# Patient Record
Sex: Male | Born: 1955 | Race: White | Hispanic: No | Smoking: Former smoker
Health system: Southern US, Community
[De-identification: ages and names within clinical notes are randomized; demographics above are authoritative.]

## PROBLEM LIST (undated history)

## (undated) DIAGNOSIS — I10 Essential (primary) hypertension: Secondary | ICD-10-CM

## (undated) DIAGNOSIS — I38 Endocarditis, valve unspecified: Secondary | ICD-10-CM

## (undated) DIAGNOSIS — E039 Hypothyroidism, unspecified: Secondary | ICD-10-CM

## (undated) DIAGNOSIS — I4892 Unspecified atrial flutter: Secondary | ICD-10-CM

## (undated) DIAGNOSIS — N179 Acute kidney failure, unspecified: Secondary | ICD-10-CM

## (undated) DIAGNOSIS — E785 Hyperlipidemia, unspecified: Secondary | ICD-10-CM

## (undated) HISTORY — DX: Hyperlipidemia, unspecified: E78.5

## (undated) HISTORY — DX: Acute kidney failure, unspecified: N17.9

## (undated) HISTORY — PX: CERVICAL SPINE SURGERY: SHX589

## (undated) HISTORY — DX: Hypothyroidism, unspecified: E03.9

## (undated) HISTORY — DX: Essential (primary) hypertension: I10

## (undated) HISTORY — DX: Endocarditis, valve unspecified: I38

## (undated) HISTORY — DX: Unspecified atrial flutter: I48.92

## (undated) HISTORY — PX: KNEE SURGERY: SHX244

---

## 2001-01-18 ENCOUNTER — Emergency Department (HOSPITAL_COMMUNITY): Admission: EM | Admit: 2001-01-18 | Discharge: 2001-01-18 | Payer: Self-pay | Admitting: Emergency Medicine

## 2001-01-18 ENCOUNTER — Encounter: Payer: Self-pay | Admitting: Emergency Medicine

## 2002-01-14 ENCOUNTER — Encounter: Payer: Self-pay | Admitting: Neurological Surgery

## 2002-01-14 ENCOUNTER — Observation Stay (HOSPITAL_COMMUNITY): Admission: RE | Admit: 2002-01-14 | Discharge: 2002-01-14 | Payer: Self-pay | Admitting: Neurological Surgery

## 2002-05-08 ENCOUNTER — Encounter: Admission: RE | Admit: 2002-05-08 | Discharge: 2002-05-08 | Payer: Self-pay | Admitting: Neurological Surgery

## 2005-08-02 ENCOUNTER — Emergency Department (HOSPITAL_COMMUNITY): Admission: EM | Admit: 2005-08-02 | Discharge: 2005-08-03 | Payer: Self-pay | Admitting: *Deleted

## 2012-08-22 DIAGNOSIS — Z01810 Encounter for preprocedural cardiovascular examination: Secondary | ICD-10-CM

## 2012-08-22 DIAGNOSIS — I4892 Unspecified atrial flutter: Secondary | ICD-10-CM

## 2012-08-26 DIAGNOSIS — I4892 Unspecified atrial flutter: Secondary | ICD-10-CM

## 2012-09-09 ENCOUNTER — Encounter: Payer: Self-pay | Admitting: *Deleted

## 2012-09-11 ENCOUNTER — Encounter: Payer: Self-pay | Admitting: *Deleted

## 2012-09-11 ENCOUNTER — Telehealth: Payer: Self-pay | Admitting: Cardiology

## 2012-09-11 ENCOUNTER — Ambulatory Visit (INDEPENDENT_AMBULATORY_CARE_PROVIDER_SITE_OTHER): Payer: 59 | Admitting: Physician Assistant

## 2012-09-11 ENCOUNTER — Encounter: Payer: Self-pay | Admitting: Physician Assistant

## 2012-09-11 ENCOUNTER — Other Ambulatory Visit: Payer: Self-pay | Admitting: *Deleted

## 2012-09-11 VITALS — BP 115/79 | HR 71 | Ht 71.0 in | Wt 203.0 lb

## 2012-09-11 DIAGNOSIS — I4891 Unspecified atrial fibrillation: Secondary | ICD-10-CM

## 2012-09-11 DIAGNOSIS — N179 Acute kidney failure, unspecified: Secondary | ICD-10-CM

## 2012-09-11 DIAGNOSIS — I38 Endocarditis, valve unspecified: Secondary | ICD-10-CM | POA: Insufficient documentation

## 2012-09-11 DIAGNOSIS — I10 Essential (primary) hypertension: Secondary | ICD-10-CM | POA: Insufficient documentation

## 2012-09-11 DIAGNOSIS — I4892 Unspecified atrial flutter: Secondary | ICD-10-CM | POA: Insufficient documentation

## 2012-09-11 NOTE — Patient Instructions (Addendum)
Your physician recommends that you schedule a follow-up appointment in: 2 weeks. Your physician has recommended you make the following change in your medication: Decrease aspirin to 81 mg daily. All other medications will remain the same.  Your physician has requested that you have en exercise stress myoview. For further information please visit [email or web address] Please follow instruction sheet, as given.

## 2012-09-11 NOTE — Assessment & Plan Note (Signed)
Mild MR and TR, by recent echocardiogram  

## 2012-09-11 NOTE — Telephone Encounter (Signed)
stress cardiolite SET FOR 09-16-2012 mmh  Checking percert

## 2012-09-11 NOTE — Assessment & Plan Note (Signed)
On current medication regimen, followed by primary M.D. 

## 2012-09-11 NOTE — Telephone Encounter (Signed)
Per [facility] online no precert required for nuc stress test for members plan

## 2012-09-11 NOTE — Progress Notes (Signed)
Primary Cardiologist: Simona Huh, MD (new)   HPI: Post hospital followup from [facility], recently seen in consultation for evaluation of new onset atrial flutter of unknown duration, and preoperative clearance.  Patient presented with no known history of heart disease, and CRFs notable for HTN, history of tobacco, and age. He had no history of prior cardiac diagnostic testing.  Following an echocardiogram which indicated NL LVF, and no focal WMAs, the patient was cleared to proceed with I&D of a perineal abscess.  Patient subsequently converted to NSR, wherein he remained by time of discharge. We assessed him with a CHADS score of 1 (HTN). He was initially treated with IV diltiazem and low-dose ASA, with final recommendation to discharge on Cardizem CD 240 daily, and to resume low-dose atenolol 25 mg daily.  Patient did require repletion of hypokalemia (low 2.9), and also received supplemental magnesium. TSH NL.   Clinically, he denies any exertional CP, PND, or tachycardia palpitations. He has since had followup blood work with his primary M.D., as well as followup with his general surgeon.   12-lead EKG today, reviewed by me, indicates NSR 71 bpm  Allergies  Allergen Reactions  . Sulfa Antibiotics Rash    Current Outpatient Prescriptions  Medication Sig Dispense Refill  . atenolol (TENORMIN) 50 MG tablet Take 50 mg by mouth daily.      Marland Kitchen HYDROcodone-acetaminophen (NORCO/VICODIN) 5-325 MG per tablet Take 1 tablet by mouth as needed.      Marland Kitchen levothyroxine (SYNTHROID, LEVOTHROID) 150 MCG tablet Take 150 mcg by mouth daily before breakfast.      . lisinopril-hydrochlorothiazide (ZESTORETIC) 10-12.5 MG per tablet Take 2 tablets by mouth daily.       . pravastatin (PRAVACHOL) 40 MG tablet Take 40 mg by mouth daily.       No current facility-administered medications for this visit.    Past Medical History  Diagnosis Date  . Hypertension   . Hypothyroidism   . Hyperlipidemia   . Atrial  flutter     CHADS: 1  . Acute renal failure   . Valvular heart disease     Mild MR, TR, echo, 07/2012    Past Surgical History  Procedure Laterality Date  . Cervical spine surgery    . Knee surgery      History   Social History  . Marital Status: Divorced    Spouse Name: N/A    Number of Children: N/A  . Years of Education: N/A   Occupational History  . Not on file.   Social History Main Topics  . Smoking status: Former Smoker -- 1.00 packs/day for 35 years    Types: Cigarettes    Start date: 05/01/1972    Quit date: 05/01/2010  . Smokeless tobacco: Former Neurosurgeon    Types: Chew    Quit date: 05/01/1988  . Alcohol Use: Not on file  . Drug Use: Not on file  . Sexually Active: Not on file   Other Topics Concern  . Not on file   Social History Narrative  . No narrative on file    Family History  Problem Relation Age of Onset  . Hypertension      ROS: no nausea, vomiting; no fever, chills; no melena, hematochezia; no claudication  PHYSICAL EXAM: BP 115/79  Pulse 71  Ht 5\' 11"  (1.803 m)  Wt 203 lb (92.08 kg)  BMI 28.33 kg/m2 GENERAL: 57 year old male; NAD HEENT: NCAT, PERRLA, EOMI; sclera clear; no xanthelasma NECK: palpable bilateral carotid pulses, no bruits;  no JVD; no TM LUNGS: CTA bilaterally CARDIAC: RRR (S1, S2); no significant murmurs; no rubs or gallops ABDOMEN: soft, non-tender; intact BS EXTREMETIES: no significant peripheral edema SKIN: warm/dry; no obvious rash/lesions MUSCULOSKELETAL: no joint deformity NEURO: no focal deficit; NL affect   EKG: reviewed and available in Electronic Records   ASSESSMENT & PLAN:  Atrial flutter Maintaining NSR, as documented by EKG. As noted, patient presented with no history of palpitations, and was unaware that he was in atrial flutter during recent hospitalization. He has a CHADS score of 1, and we will decrease full dose ASA to 81 mg daily. Of note, he was not discharged on Cardizem, as we had  recommended. However, he presents with stable BP and heart rate, in NSR, and can continue on his previous dose of atenolol. We will, however, schedule him for an exercise stress Cardiolite test for risk stratification, given his noted CRFs. He also had slight troponin elevation (0.09), postoperatively. He also has history of tobacco smoking, and quit approximately 3 years ago.   Hypertension On current medication regimen, followed by primary M.D.  Valvular heart disease Mild MR and TR, by recent echocardiogram    Gene Slate Debroux, PAC

## 2012-09-11 NOTE — Assessment & Plan Note (Signed)
Maintaining NSR, as documented by EKG. As noted, patient presented with no history of palpitations, and was unaware that he was in atrial flutter during recent hospitalization. He has a CHADS score of 1, and we will decrease full dose ASA to 81 mg daily. Of note, he was not discharged on Cardizem, as we had recommended. However, he presents with stable BP and heart rate, in NSR, and can continue on his previous dose of atenolol. We will, however, schedule him for an exercise stress Cardiolite test for risk stratification, given his noted CRFs. He also had slight troponin elevation (0.09), postoperatively. He also has history of tobacco smoking, and quit approximately 3 years ago.

## 2012-09-16 DIAGNOSIS — I4891 Unspecified atrial fibrillation: Secondary | ICD-10-CM

## 2012-09-26 ENCOUNTER — Telehealth: Payer: Self-pay | Admitting: *Deleted

## 2012-09-26 NOTE — Telephone Encounter (Signed)
Notes Recorded by Lesle Chris, LPN on 1/47/8295 at 10:47 AM Patient notified. Already has follow up scheduled for 6/2 with Gene Serpe, PA.

## 2012-09-26 NOTE — Telephone Encounter (Signed)
Message copied by Lesle Chris on Thu Sep 26, 2012 10:47 AM ------      Message from: Prescott Parma C      Created: Wed Sep 18, 2012  4:59 PM       Neg stress test       ------

## 2012-09-30 ENCOUNTER — Ambulatory Visit (INDEPENDENT_AMBULATORY_CARE_PROVIDER_SITE_OTHER): Payer: 59 | Admitting: Physician Assistant

## 2012-09-30 ENCOUNTER — Encounter: Payer: Self-pay | Admitting: Physician Assistant

## 2012-09-30 VITALS — BP 112/76 | HR 78 | Ht 71.0 in | Wt 205.0 lb

## 2012-09-30 DIAGNOSIS — I4892 Unspecified atrial flutter: Secondary | ICD-10-CM

## 2012-09-30 DIAGNOSIS — I38 Endocarditis, valve unspecified: Secondary | ICD-10-CM

## 2012-09-30 DIAGNOSIS — I10 Essential (primary) hypertension: Secondary | ICD-10-CM

## 2012-09-30 NOTE — Progress Notes (Signed)
Primary Cardiologist: Simona Huh, MD   HPI: Scheduled early followup for review of GXT Cardiolite result, arranged at time of recent post hospital followup, to screen for ischemic heart disease, given multiple CRFs, and recent presentation with atrial flutter and slightly elevated troponin.   - GXT Cardiolite, 5/19: low risk study with NL perfusion; EF 61% (90% MPHR)  Patient continues to report no CP or tachycardia palpitations. He does, however, complain of occasional lightheadedness, which he ascribes to increased dose of Zestoretic approximately 2 months ago, by his primary M.D. He denies any associated tachycardia palpitations. This is typically precipitated after bending over. He also suggests 1 vertigo-like episode.  Allergies  Allergen Reactions  . Sulfa Antibiotics Rash    Current Outpatient Prescriptions  Medication Sig Dispense Refill  . aspirin EC 81 MG tablet Take 81 mg by mouth daily.      Marland Kitchen atenolol (TENORMIN) 50 MG tablet Take 50 mg by mouth daily.      Marland Kitchen HYDROcodone-acetaminophen (NORCO/VICODIN) 5-325 MG per tablet Take 1 tablet by mouth as needed.      Marland Kitchen levothyroxine (SYNTHROID, LEVOTHROID) 150 MCG tablet Take 150 mcg by mouth daily before breakfast.      . lisinopril-hydrochlorothiazide (ZESTORETIC) 10-12.5 MG per tablet Take 2 tablets by mouth daily.       . pravastatin (PRAVACHOL) 40 MG tablet Take 40 mg by mouth daily.       No current facility-administered medications for this visit.    Past Medical History  Diagnosis Date  . Hypertension   . Hypothyroidism   . Hyperlipidemia   . Atrial flutter     CHADS: 1  . Acute renal failure   . Valvular heart disease     Mild MR, TR, echo, 07/2012    Past Surgical History  Procedure Laterality Date  . Cervical spine surgery    . Knee surgery      History   Social History  . Marital Status: Divorced    Spouse Name: N/A    Number of Children: N/A  . Years of Education: N/A   Occupational History  . Not  on file.   Social History Main Topics  . Smoking status: Former Smoker -- 1.00 packs/day for 35 years    Types: Cigarettes    Start date: 05/01/1972    Quit date: 05/01/2010  . Smokeless tobacco: Former Neurosurgeon    Types: Chew    Quit date: 05/01/1988  . Alcohol Use: Not on file  . Drug Use: Not on file  . Sexually Active: Not on file   Other Topics Concern  . Not on file   Social History Narrative  . No narrative on file    Family History  Problem Relation Age of Onset  . Hypertension      ROS: no nausea, vomiting; no fever, chills; no melena, hematochezia; no claudication  PHYSICAL EXAM: BP 112/76  Pulse 78  Ht 5\' 11"  (1.803 m)  Wt 205 lb (92.987 kg)  BMI 28.6 kg/m2 GENERAL: 57 year old male; NAD  HEENT: NCAT, PERRLA, EOMI; sclera clear; no xanthelasma  NECK: palpable bilateral carotid pulses, no bruits; no JVD; no TM  LUNGS: CTA bilaterally  CARDIAC: RRR (S1, S2); no significant murmurs; no rubs or gallops  ABDOMEN: soft, non-tender; intact BS  EXTREMETIES: no significant peripheral edema  SKIN: warm/dry; no obvious rash/lesions  MUSCULOSKELETAL: no joint deformity  NEURO: no focal deficit; NL affec    EKG:    ASSESSMENT & PLAN:  Atrial flutter  Maintaining NSR by history and exam, and as documented by recent EKG. Patient has a CHADS score of 1, and we decreased ASA to 81 mg daily, at time of last OV. He remains on the same dose of atenolol. Recent screening test for underlying occult ischemia was negative. Therefore, no further workup indicated, and we will arrange for patient to resume followup with Dr. Diona Browner in 6 months.  Hypertension Well-controlled on current medication regimen, followed by primary M.D. Of note, however, patient suggests development of intermittent dizziness, following increase in Zestoretic dose. I advised him to followup with Dr. Margo Common for further recommendations.  Valvular heart disease Mild MR and TR, by recent  echocardiogram     Jonathan Gaines, PAC

## 2012-09-30 NOTE — Assessment & Plan Note (Signed)
Mild MR and TR, by recent echocardiogram

## 2012-09-30 NOTE — Patient Instructions (Signed)
Continue all current medications. Your physician wants you to follow up in: 6 months.  You will receive a reminder letter in the mail one-two months in advance.  If you don't receive a letter, please call our office to schedule the follow up appointment   

## 2012-09-30 NOTE — Assessment & Plan Note (Signed)
Maintaining NSR by history and exam, and as documented by recent EKG. Patient has a CHADS score of 1, and we decreased ASA to 81 mg daily, at time of last OV. He remains on the same dose of atenolol. Recent screening test for underlying occult ischemia was negative. Therefore, no further workup indicated, and we will arrange for patient to resume followup with Dr. Diona Browner in 6 months.

## 2012-09-30 NOTE — Assessment & Plan Note (Signed)
Well-controlled on current medication regimen, followed by primary M.D. Of note, however, patient suggests development of intermittent dizziness, following increase in Zestoretic dose. I advised him to followup with Dr. Margo Common for further recommendations.

## 2014-05-07 ENCOUNTER — Other Ambulatory Visit (HOSPITAL_COMMUNITY): Payer: Self-pay | Admitting: Family Medicine

## 2014-05-07 ENCOUNTER — Ambulatory Visit (HOSPITAL_COMMUNITY)
Admission: RE | Admit: 2014-05-07 | Discharge: 2014-05-07 | Disposition: A | Discharge status: Home / Self Care | Payer: Disability Insurance | Source: Ambulatory Visit | Attending: Family Medicine | Admitting: Family Medicine

## 2014-05-07 DIAGNOSIS — Z981 Arthrodesis status: Secondary | ICD-10-CM | POA: Insufficient documentation

## 2014-05-07 DIAGNOSIS — M542 Cervicalgia: Secondary | ICD-10-CM | POA: Diagnosis not present

## 2014-05-07 DIAGNOSIS — M792 Neuralgia and neuritis, unspecified: Secondary | ICD-10-CM
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
## 2016-09-21 DIAGNOSIS — M159 Polyosteoarthritis, unspecified: Secondary | ICD-10-CM | POA: Diagnosis not present

## 2016-09-21 DIAGNOSIS — M5030 Other cervical disc degeneration, unspecified cervical region: Secondary | ICD-10-CM | POA: Diagnosis not present

## 2016-09-21 DIAGNOSIS — E039 Hypothyroidism, unspecified: Secondary | ICD-10-CM | POA: Diagnosis not present

## 2016-09-21 DIAGNOSIS — I10 Essential (primary) hypertension: Secondary | ICD-10-CM | POA: Diagnosis not present

## 2016-09-21 DIAGNOSIS — K219 Gastro-esophageal reflux disease without esophagitis: Secondary | ICD-10-CM | POA: Diagnosis not present

## 2016-09-21 DIAGNOSIS — Z0000 Encounter for general adult medical examination without abnormal findings: Secondary | ICD-10-CM | POA: Diagnosis not present

## 2016-09-21 DIAGNOSIS — N50812 Left testicular pain: Secondary | ICD-10-CM | POA: Diagnosis not present

## 2016-09-21 DIAGNOSIS — E782 Mixed hyperlipidemia: Secondary | ICD-10-CM | POA: Diagnosis not present

## 2016-09-21 DIAGNOSIS — R7301 Impaired fasting glucose: Secondary | ICD-10-CM | POA: Diagnosis not present

## 2016-09-21 DIAGNOSIS — Z6831 Body mass index (BMI) 31.0-31.9, adult: Secondary | ICD-10-CM | POA: Diagnosis not present

## 2016-11-15 DIAGNOSIS — H3563 Retinal hemorrhage, bilateral: Secondary | ICD-10-CM | POA: Diagnosis not present

## 2016-11-15 DIAGNOSIS — H2513 Age-related nuclear cataract, bilateral: Secondary | ICD-10-CM | POA: Diagnosis not present

## 2016-11-15 DIAGNOSIS — H11153 Pinguecula, bilateral: Secondary | ICD-10-CM | POA: Diagnosis not present

## 2016-11-15 DIAGNOSIS — H31002 Unspecified chorioretinal scars, left eye: Secondary | ICD-10-CM | POA: Diagnosis not present

## 2017-02-20 DIAGNOSIS — E039 Hypothyroidism, unspecified: Secondary | ICD-10-CM | POA: Diagnosis not present

## 2017-02-20 DIAGNOSIS — D485 Neoplasm of uncertain behavior of skin: Secondary | ICD-10-CM | POA: Diagnosis not present

## 2017-02-20 DIAGNOSIS — I10 Essential (primary) hypertension: Secondary | ICD-10-CM | POA: Diagnosis not present

## 2017-03-13 DIAGNOSIS — L989 Disorder of the skin and subcutaneous tissue, unspecified: Secondary | ICD-10-CM | POA: Diagnosis not present

## 2017-03-13 DIAGNOSIS — R300 Dysuria: Secondary | ICD-10-CM | POA: Diagnosis not present

## 2017-03-13 DIAGNOSIS — I10 Essential (primary) hypertension: Secondary | ICD-10-CM | POA: Diagnosis not present

## 2017-03-13 DIAGNOSIS — N5082 Scrotal pain: Secondary | ICD-10-CM | POA: Diagnosis not present

## 2017-03-13 DIAGNOSIS — R369 Urethral discharge, unspecified: Secondary | ICD-10-CM | POA: Diagnosis not present

## 2017-03-13 DIAGNOSIS — R319 Hematuria, unspecified: Secondary | ICD-10-CM | POA: Diagnosis not present

## 2017-03-14 DIAGNOSIS — D229 Melanocytic nevi, unspecified: Secondary | ICD-10-CM | POA: Diagnosis not present

## 2017-03-14 DIAGNOSIS — C44319 Basal cell carcinoma of skin of other parts of face: Secondary | ICD-10-CM | POA: Diagnosis not present

## 2017-03-14 DIAGNOSIS — L820 Inflamed seborrheic keratosis: Secondary | ICD-10-CM | POA: Diagnosis not present

## 2017-03-14 DIAGNOSIS — D492 Neoplasm of unspecified behavior of bone, soft tissue, and skin: Secondary | ICD-10-CM | POA: Diagnosis not present

## 2017-03-14 DIAGNOSIS — L821 Other seborrheic keratosis: Secondary | ICD-10-CM | POA: Diagnosis not present

## 2017-03-27 DIAGNOSIS — N50812 Left testicular pain: Secondary | ICD-10-CM | POA: Diagnosis not present

## 2017-03-27 DIAGNOSIS — N5082 Scrotal pain: Secondary | ICD-10-CM | POA: Diagnosis not present

## 2017-03-27 DIAGNOSIS — N503 Cyst of epididymis: Secondary | ICD-10-CM | POA: Diagnosis not present

## 2017-03-27 DIAGNOSIS — R319 Hematuria, unspecified: Secondary | ICD-10-CM | POA: Diagnosis not present

## 2017-04-12 DIAGNOSIS — C44319 Basal cell carcinoma of skin of other parts of face: Secondary | ICD-10-CM | POA: Diagnosis not present

## 2017-04-20 DIAGNOSIS — Z4802 Encounter for removal of sutures: Secondary | ICD-10-CM | POA: Diagnosis not present

## 2017-07-24 DIAGNOSIS — Z6831 Body mass index (BMI) 31.0-31.9, adult: Secondary | ICD-10-CM | POA: Diagnosis not present

## 2017-07-24 DIAGNOSIS — Z2821 Immunization not carried out because of patient refusal: Secondary | ICD-10-CM | POA: Diagnosis not present

## 2017-07-24 DIAGNOSIS — E039 Hypothyroidism, unspecified: Secondary | ICD-10-CM | POA: Diagnosis not present

## 2017-07-24 DIAGNOSIS — Z87891 Personal history of nicotine dependence: Secondary | ICD-10-CM | POA: Diagnosis not present

## 2017-07-24 DIAGNOSIS — I10 Essential (primary) hypertension: Secondary | ICD-10-CM | POA: Diagnosis not present

## 2017-07-24 DIAGNOSIS — Z299 Encounter for prophylactic measures, unspecified: Secondary | ICD-10-CM | POA: Diagnosis not present

## 2017-10-26 DIAGNOSIS — E039 Hypothyroidism, unspecified: Secondary | ICD-10-CM | POA: Diagnosis not present

## 2017-10-26 DIAGNOSIS — Z299 Encounter for prophylactic measures, unspecified: Secondary | ICD-10-CM | POA: Diagnosis not present

## 2017-10-26 DIAGNOSIS — M109 Gout, unspecified: Secondary | ICD-10-CM | POA: Diagnosis not present

## 2017-10-26 DIAGNOSIS — I10 Essential (primary) hypertension: Secondary | ICD-10-CM | POA: Diagnosis not present

## 2017-10-26 DIAGNOSIS — Z6830 Body mass index (BMI) 30.0-30.9, adult: Secondary | ICD-10-CM | POA: Diagnosis not present

## 2017-11-23 DIAGNOSIS — Z125 Encounter for screening for malignant neoplasm of prostate: Secondary | ICD-10-CM | POA: Diagnosis not present

## 2017-11-23 DIAGNOSIS — S2242XA Multiple fractures of ribs, left side, initial encounter for closed fracture: Secondary | ICD-10-CM | POA: Diagnosis not present

## 2017-11-23 DIAGNOSIS — Z1339 Encounter for screening examination for other mental health and behavioral disorders: Secondary | ICD-10-CM | POA: Diagnosis not present

## 2017-11-23 DIAGNOSIS — I10 Essential (primary) hypertension: Secondary | ICD-10-CM | POA: Diagnosis not present

## 2017-11-23 DIAGNOSIS — Z79899 Other long term (current) drug therapy: Secondary | ICD-10-CM | POA: Diagnosis not present

## 2017-11-23 DIAGNOSIS — M546 Pain in thoracic spine: Secondary | ICD-10-CM | POA: Diagnosis not present

## 2017-11-23 DIAGNOSIS — R0789 Other chest pain: Secondary | ICD-10-CM | POA: Diagnosis not present

## 2017-11-23 DIAGNOSIS — Z981 Arthrodesis status: Secondary | ICD-10-CM | POA: Diagnosis not present

## 2017-11-23 DIAGNOSIS — Z6830 Body mass index (BMI) 30.0-30.9, adult: Secondary | ICD-10-CM | POA: Diagnosis not present

## 2017-11-23 DIAGNOSIS — M419 Scoliosis, unspecified: Secondary | ICD-10-CM | POA: Diagnosis not present

## 2017-11-23 DIAGNOSIS — M47814 Spondylosis without myelopathy or radiculopathy, thoracic region: Secondary | ICD-10-CM | POA: Diagnosis not present

## 2017-11-23 DIAGNOSIS — Z299 Encounter for prophylactic measures, unspecified: Secondary | ICD-10-CM | POA: Diagnosis not present

## 2017-11-23 DIAGNOSIS — E039 Hypothyroidism, unspecified: Secondary | ICD-10-CM | POA: Diagnosis not present

## 2017-11-23 DIAGNOSIS — Z1331 Encounter for screening for depression: Secondary | ICD-10-CM | POA: Diagnosis not present

## 2017-11-23 DIAGNOSIS — Z0000 Encounter for general adult medical examination without abnormal findings: Secondary | ICD-10-CM | POA: Diagnosis not present

## 2017-11-23 DIAGNOSIS — Z1211 Encounter for screening for malignant neoplasm of colon: Secondary | ICD-10-CM | POA: Diagnosis not present

## 2017-11-23 DIAGNOSIS — Z7189 Other specified counseling: Secondary | ICD-10-CM | POA: Diagnosis not present

## 2017-11-23 DIAGNOSIS — Z8781 Personal history of (healed) traumatic fracture: Secondary | ICD-10-CM | POA: Diagnosis not present

## 2017-11-23 DIAGNOSIS — M545 Low back pain: Secondary | ICD-10-CM | POA: Diagnosis not present

## 2017-12-18 DIAGNOSIS — R195 Other fecal abnormalities: Secondary | ICD-10-CM | POA: Diagnosis not present

## 2018-01-01 DIAGNOSIS — Z6829 Body mass index (BMI) 29.0-29.9, adult: Secondary | ICD-10-CM | POA: Diagnosis not present

## 2018-01-01 DIAGNOSIS — Z882 Allergy status to sulfonamides status: Secondary | ICD-10-CM | POA: Diagnosis not present

## 2018-01-01 DIAGNOSIS — E669 Obesity, unspecified: Secondary | ICD-10-CM | POA: Diagnosis not present

## 2018-01-01 DIAGNOSIS — M159 Polyosteoarthritis, unspecified: Secondary | ICD-10-CM | POA: Diagnosis not present

## 2018-01-01 DIAGNOSIS — Z7982 Long term (current) use of aspirin: Secondary | ICD-10-CM | POA: Diagnosis not present

## 2018-01-01 DIAGNOSIS — Z87442 Personal history of urinary calculi: Secondary | ICD-10-CM | POA: Diagnosis not present

## 2018-01-01 DIAGNOSIS — R400 Somnolence: Secondary | ICD-10-CM | POA: Diagnosis not present

## 2018-01-01 DIAGNOSIS — I10 Essential (primary) hypertension: Secondary | ICD-10-CM | POA: Diagnosis not present

## 2018-01-01 DIAGNOSIS — M5030 Other cervical disc degeneration, unspecified cervical region: Secondary | ICD-10-CM | POA: Diagnosis not present

## 2018-01-01 DIAGNOSIS — E039 Hypothyroidism, unspecified: Secondary | ICD-10-CM | POA: Diagnosis not present

## 2018-01-01 DIAGNOSIS — R195 Other fecal abnormalities: Secondary | ICD-10-CM | POA: Diagnosis not present

## 2018-01-01 DIAGNOSIS — K219 Gastro-esophageal reflux disease without esophagitis: Secondary | ICD-10-CM | POA: Diagnosis not present

## 2018-01-01 DIAGNOSIS — Z79899 Other long term (current) drug therapy: Secondary | ICD-10-CM | POA: Diagnosis not present

## 2018-01-01 DIAGNOSIS — M109 Gout, unspecified: Secondary | ICD-10-CM | POA: Diagnosis not present

## 2018-01-01 DIAGNOSIS — K921 Melena: Secondary | ICD-10-CM | POA: Diagnosis not present

## 2018-01-01 DIAGNOSIS — G609 Hereditary and idiopathic neuropathy, unspecified: Secondary | ICD-10-CM | POA: Diagnosis not present

## 2018-01-01 DIAGNOSIS — E782 Mixed hyperlipidemia: Secondary | ICD-10-CM | POA: Diagnosis not present

## 2018-01-07 DIAGNOSIS — R945 Abnormal results of liver function studies: Secondary | ICD-10-CM | POA: Diagnosis not present

## 2018-04-02 DIAGNOSIS — H31002 Unspecified chorioretinal scars, left eye: Secondary | ICD-10-CM | POA: Diagnosis not present

## 2018-04-02 DIAGNOSIS — H11153 Pinguecula, bilateral: Secondary | ICD-10-CM | POA: Diagnosis not present

## 2018-04-02 DIAGNOSIS — H3563 Retinal hemorrhage, bilateral: Secondary | ICD-10-CM | POA: Diagnosis not present

## 2018-04-02 DIAGNOSIS — H2513 Age-related nuclear cataract, bilateral: Secondary | ICD-10-CM | POA: Diagnosis not present

## 2018-04-03 DIAGNOSIS — Z2821 Immunization not carried out because of patient refusal: Secondary | ICD-10-CM | POA: Diagnosis not present

## 2018-04-03 DIAGNOSIS — Z299 Encounter for prophylactic measures, unspecified: Secondary | ICD-10-CM | POA: Diagnosis not present

## 2018-04-03 DIAGNOSIS — I10 Essential (primary) hypertension: Secondary | ICD-10-CM | POA: Diagnosis not present

## 2018-04-03 DIAGNOSIS — Z6832 Body mass index (BMI) 32.0-32.9, adult: Secondary | ICD-10-CM | POA: Diagnosis not present

## 2018-04-03 DIAGNOSIS — M109 Gout, unspecified: Secondary | ICD-10-CM | POA: Diagnosis not present

## 2018-04-03 DIAGNOSIS — E039 Hypothyroidism, unspecified: Secondary | ICD-10-CM | POA: Diagnosis not present

## 2018-04-15 DIAGNOSIS — I10 Essential (primary) hypertension: Secondary | ICD-10-CM | POA: Diagnosis not present

## 2018-05-07 DIAGNOSIS — I10 Essential (primary) hypertension: Secondary | ICD-10-CM | POA: Diagnosis not present

## 2018-05-07 DIAGNOSIS — M109 Gout, unspecified: Secondary | ICD-10-CM | POA: Diagnosis not present

## 2018-05-07 DIAGNOSIS — Z87891 Personal history of nicotine dependence: Secondary | ICD-10-CM | POA: Diagnosis not present

## 2018-05-07 DIAGNOSIS — Z6831 Body mass index (BMI) 31.0-31.9, adult: Secondary | ICD-10-CM | POA: Diagnosis not present

## 2018-05-07 DIAGNOSIS — R945 Abnormal results of liver function studies: Secondary | ICD-10-CM | POA: Diagnosis not present

## 2018-05-07 DIAGNOSIS — Z299 Encounter for prophylactic measures, unspecified: Secondary | ICD-10-CM | POA: Diagnosis not present

## 2018-05-07 DIAGNOSIS — M1990 Unspecified osteoarthritis, unspecified site: Secondary | ICD-10-CM | POA: Diagnosis not present

## 2018-05-13 DIAGNOSIS — R945 Abnormal results of liver function studies: Secondary | ICD-10-CM | POA: Diagnosis not present

## 2018-05-13 DIAGNOSIS — K760 Fatty (change of) liver, not elsewhere classified: Secondary | ICD-10-CM | POA: Diagnosis not present

## 2018-07-11 DIAGNOSIS — H2513 Age-related nuclear cataract, bilateral: Secondary | ICD-10-CM | POA: Diagnosis not present

## 2018-07-11 DIAGNOSIS — H3563 Retinal hemorrhage, bilateral: Secondary | ICD-10-CM | POA: Diagnosis not present

## 2018-11-12 ENCOUNTER — Encounter: Payer: Self-pay | Admitting: *Deleted

## 2018-11-27 DIAGNOSIS — Z6830 Body mass index (BMI) 30.0-30.9, adult: Secondary | ICD-10-CM | POA: Diagnosis not present

## 2018-11-27 DIAGNOSIS — I10 Essential (primary) hypertension: Secondary | ICD-10-CM | POA: Diagnosis not present

## 2018-11-27 DIAGNOSIS — Z1331 Encounter for screening for depression: Secondary | ICD-10-CM | POA: Diagnosis not present

## 2018-11-27 DIAGNOSIS — Z79899 Other long term (current) drug therapy: Secondary | ICD-10-CM | POA: Diagnosis not present

## 2018-11-27 DIAGNOSIS — R5383 Other fatigue: Secondary | ICD-10-CM | POA: Diagnosis not present

## 2018-11-27 DIAGNOSIS — E039 Hypothyroidism, unspecified: Secondary | ICD-10-CM | POA: Diagnosis not present

## 2018-11-27 DIAGNOSIS — Z0000 Encounter for general adult medical examination without abnormal findings: Secondary | ICD-10-CM | POA: Diagnosis not present

## 2018-11-27 DIAGNOSIS — Z125 Encounter for screening for malignant neoplasm of prostate: Secondary | ICD-10-CM | POA: Diagnosis not present

## 2018-11-27 DIAGNOSIS — Z1339 Encounter for screening examination for other mental health and behavioral disorders: Secondary | ICD-10-CM | POA: Diagnosis not present

## 2018-11-27 DIAGNOSIS — Z7189 Other specified counseling: Secondary | ICD-10-CM | POA: Diagnosis not present

## 2018-11-27 DIAGNOSIS — Z299 Encounter for prophylactic measures, unspecified: Secondary | ICD-10-CM | POA: Diagnosis not present

## 2019-01-01 DIAGNOSIS — M109 Gout, unspecified: Secondary | ICD-10-CM | POA: Diagnosis not present

## 2019-01-01 DIAGNOSIS — Z6830 Body mass index (BMI) 30.0-30.9, adult: Secondary | ICD-10-CM | POA: Diagnosis not present

## 2019-01-01 DIAGNOSIS — Z299 Encounter for prophylactic measures, unspecified: Secondary | ICD-10-CM | POA: Diagnosis not present

## 2019-01-01 DIAGNOSIS — M2550 Pain in unspecified joint: Secondary | ICD-10-CM | POA: Diagnosis not present

## 2019-01-01 DIAGNOSIS — I10 Essential (primary) hypertension: Secondary | ICD-10-CM | POA: Diagnosis not present

## 2019-01-01 DIAGNOSIS — M1990 Unspecified osteoarthritis, unspecified site: Secondary | ICD-10-CM | POA: Diagnosis not present

## 2019-01-02 DIAGNOSIS — M109 Gout, unspecified: Secondary | ICD-10-CM | POA: Diagnosis not present

## 2019-01-02 DIAGNOSIS — M2550 Pain in unspecified joint: Secondary | ICD-10-CM | POA: Diagnosis not present

## 2019-02-11 DIAGNOSIS — H2513 Age-related nuclear cataract, bilateral: Secondary | ICD-10-CM | POA: Diagnosis not present

## 2019-02-11 DIAGNOSIS — H43811 Vitreous degeneration, right eye: Secondary | ICD-10-CM | POA: Diagnosis not present

## 2019-11-11 DIAGNOSIS — H524 Presbyopia: Secondary | ICD-10-CM | POA: Diagnosis not present

## 2019-11-11 DIAGNOSIS — H43813 Vitreous degeneration, bilateral: Secondary | ICD-10-CM | POA: Diagnosis not present

## 2019-11-11 DIAGNOSIS — H52203 Unspecified astigmatism, bilateral: Secondary | ICD-10-CM | POA: Diagnosis not present

## 2019-11-11 DIAGNOSIS — H2513 Age-related nuclear cataract, bilateral: Secondary | ICD-10-CM | POA: Diagnosis not present

## 2019-12-10 DIAGNOSIS — Z1339 Encounter for screening examination for other mental health and behavioral disorders: Secondary | ICD-10-CM | POA: Diagnosis not present

## 2019-12-10 DIAGNOSIS — I10 Essential (primary) hypertension: Secondary | ICD-10-CM | POA: Diagnosis not present

## 2019-12-10 DIAGNOSIS — Z125 Encounter for screening for malignant neoplasm of prostate: Secondary | ICD-10-CM | POA: Diagnosis not present

## 2019-12-10 DIAGNOSIS — Z7189 Other specified counseling: Secondary | ICD-10-CM | POA: Diagnosis not present

## 2019-12-10 DIAGNOSIS — Z299 Encounter for prophylactic measures, unspecified: Secondary | ICD-10-CM | POA: Diagnosis not present

## 2019-12-10 DIAGNOSIS — Z6831 Body mass index (BMI) 31.0-31.9, adult: Secondary | ICD-10-CM | POA: Diagnosis not present

## 2019-12-10 DIAGNOSIS — E039 Hypothyroidism, unspecified: Secondary | ICD-10-CM | POA: Diagnosis not present

## 2019-12-10 DIAGNOSIS — R5383 Other fatigue: Secondary | ICD-10-CM | POA: Diagnosis not present

## 2019-12-10 DIAGNOSIS — Z1331 Encounter for screening for depression: Secondary | ICD-10-CM | POA: Diagnosis not present

## 2019-12-10 DIAGNOSIS — Z79899 Other long term (current) drug therapy: Secondary | ICD-10-CM | POA: Diagnosis not present

## 2019-12-10 DIAGNOSIS — Z0000 Encounter for general adult medical examination without abnormal findings: Secondary | ICD-10-CM | POA: Diagnosis not present

## 2020-02-03 DIAGNOSIS — I10 Essential (primary) hypertension: Secondary | ICD-10-CM | POA: Diagnosis not present

## 2020-02-03 DIAGNOSIS — Z299 Encounter for prophylactic measures, unspecified: Secondary | ICD-10-CM | POA: Diagnosis not present

## 2020-02-03 DIAGNOSIS — Z87891 Personal history of nicotine dependence: Secondary | ICD-10-CM | POA: Diagnosis not present

## 2020-02-03 DIAGNOSIS — L508 Other urticaria: Secondary | ICD-10-CM | POA: Diagnosis not present

## 2021-11-14 ENCOUNTER — Other Ambulatory Visit: Payer: Medicare Other

## 2021-11-14 ENCOUNTER — Ambulatory Visit (INDEPENDENT_AMBULATORY_CARE_PROVIDER_SITE_OTHER): Payer: Medicare Other | Admitting: Urology

## 2021-11-14 VITALS — BP 121/67 | HR 62

## 2021-11-14 DIAGNOSIS — N5201 Erectile dysfunction due to arterial insufficiency: Secondary | ICD-10-CM | POA: Diagnosis not present

## 2021-11-14 DIAGNOSIS — R6882 Decreased libido: Secondary | ICD-10-CM | POA: Diagnosis not present

## 2021-11-14 DIAGNOSIS — R351 Nocturia: Secondary | ICD-10-CM

## 2021-11-14 DIAGNOSIS — N138 Other obstructive and reflux uropathy: Secondary | ICD-10-CM

## 2021-11-14 DIAGNOSIS — N401 Enlarged prostate with lower urinary tract symptoms: Secondary | ICD-10-CM

## 2021-11-14 LAB — URINALYSIS, ROUTINE W REFLEX MICROSCOPIC
Bilirubin, UA: NEGATIVE
Glucose, UA: NEGATIVE
Ketones, UA: NEGATIVE
Nitrite, UA: NEGATIVE
Protein,UA: NEGATIVE
Specific Gravity, UA: 1.01 (ref 1.005–1.030)
Urobilinogen, Ur: 0.2 mg/dL (ref 0.2–1.0)
pH, UA: 6.5 (ref 5.0–7.5)

## 2021-11-14 LAB — MICROSCOPIC EXAMINATION
Bacteria, UA: NONE SEEN
Renal Epithel, UA: NONE SEEN /hpf

## 2021-11-14 MED ORDER — ALFUZOSIN HCL ER 10 MG PO TB24: 10.0000 mg | ORAL_TABLET | Freq: Every day | ORAL | 11 refills | Status: DC

## 2021-11-14 NOTE — Progress Notes (Signed)
11/14/2021 2:10 PM   Jonathan Gaines 1956-01-02 [id number]  Referring provider: Deloria Lair., MD [place],  [place] [address]  Nocturia and erectile dysfunction   HPI: Mr Jonathan Gaines is a 66yo here for evaluation of nocturia and erectile dysfunction. For the past 3 years he has noted increased nocturia and a weaker stream. IPSS 12 QOL 3. Nocturia 3x. He has issues getting and maintaining an erection for the past year. He has decreased libido.    PMH: Past Medical History:  Diagnosis Date   Acute renal failure ([place])    Atrial flutter ([facility])    CHADS: 1   Hyperlipidemia    Hypertension    Hypothyroidism    Valvular heart disease    Mild MR, TR, echo, 07/2012    Surgical History:   Home Medications:  Allergies as of 11/14/2021       Reactions   Sulfa Antibiotics Rash        Medication List        Accurate as of November 14, 2021  2:10 PM. If you have any questions, ask your nurse or doctor.          aspirin EC 81 MG tablet Take 81 mg by mouth daily.   atenolol 50 MG tablet Commonly known as: TENORMIN Take 50 mg by mouth daily.   diclofenac 75 MG EC tablet Commonly known as: VOLTAREN Take 75 mg by mouth 2 (two) times daily.   fenofibrate 160 MG tablet Take 160 mg by mouth daily.   HYDROcodone-acetaminophen 5-325 MG tablet Commonly known as: NORCO/VICODIN Take 1 tablet by mouth as needed.   levothyroxine 150 MCG tablet Commonly known as: SYNTHROID Take 150 mcg by mouth daily before breakfast.   pravastatin 40 MG tablet Commonly known as: PRAVACHOL Take 40 mg by mouth daily.   Zestoretic 10-12.5 MG tablet Generic drug: lisinopril-hydrochlorothiazide Take 2 tablets by mouth daily.        Allergies:  Allergies  Allergen Reactions   Sulfa Antibiotics Rash    Family History: Family History  Problem Relation Age of Onset   Hypertension Other     Social History:  reports that he quit smoking about 11 years ago. His smoking  use included cigarettes. He started smoking about 49 years ago. He has a 35.00 pack-year smoking history. He quit smokeless tobacco use about 33 years ago.  His smokeless tobacco use included chew. No history on file for alcohol use and drug use.  ROS: All other review of systems were reviewed and are negative except what is noted above in HPI  Physical Exam: BP 121/67   Pulse 62   Constitutional:  Alert and oriented, No acute distress. HEENT:  AT, moist mucus membranes.  Trachea midline, no masses. Cardiovascular: No clubbing, cyanosis, or edema. Respiratory: Normal respiratory effort, no increased work of breathing. GI: Abdomen is soft, nontender, nondistended, no abdominal masses GU: No CVA tenderness. Uncircumcised phallus. No masses/lesions on penis, testes, epididymis. Prostate 30g smooth, no nodules and no induration.  Lymph: No cervical or inguinal lymphadenopathy. Skin: No rashes, bruises or suspicious lesions. Neurologic: Grossly intact, no focal deficits, moving all 4 extremities. Psychiatric: Normal mood and affect.  Laboratory Data: No results found for: "WBC", "HGB", "HCT", "MCV", "PLT"  No results found for: "CREATININE"  No results found for: "PSA"  No results found for: "TESTOSTERONE"  No results found for: "HGBA1C"  Urinalysis No results found for: "COLORURINE", "APPEARANCEUR", "LABSPEC", "PHURINE", "GLUCOSEU", "HGBUR", "BILIRUBINUR", "KETONESUR", "  PROTEINUR", "UROBILINOGEN", "NITRITE", "LEUKOCYTESUR"  No results found for: "LABMICR", "WBCUA", "RBCUA", "LABEPIT", "MUCUS", "BACTERIA"  Pertinent Imaging:  No results found for this or any previous visit.  No results found for this or any previous visit.  No results found for this or any previous visit.  No results found for this or any previous visit.  No results found for this or any previous visit.  No results found for this or any previous visit.  No results found for this or any previous  visit.  No results found for this or any previous visit.   Assessment & Plan:    1. Nocturia -we will trial uroxatral '10mg'$  qhs - Urinalysis, Routine w reflex microscopic  2. Erectile dysfunction due to arterial insufficiency -We will defer therapy until after we have addressed his urinary complaints   No follow-ups on file.  Nicolette Bang, MD  [facility] [place]

## 2021-11-22 ENCOUNTER — Encounter: Payer: Self-pay | Admitting: Urology

## 2021-11-22 NOTE — Patient Instructions (Signed)

## 2021-12-15 ENCOUNTER — Other Ambulatory Visit: Payer: Medicare Other

## 2021-12-15 DIAGNOSIS — N138 Other obstructive and reflux uropathy: Secondary | ICD-10-CM

## 2021-12-15 DIAGNOSIS — R6882 Decreased libido: Secondary | ICD-10-CM

## 2021-12-21 ENCOUNTER — Ambulatory Visit: Payer: Medicare Other | Admitting: Urology

## 2021-12-21 ENCOUNTER — Other Ambulatory Visit: Payer: Self-pay

## 2021-12-21 VITALS — BP 149/77 | HR 71

## 2021-12-21 DIAGNOSIS — N5201 Erectile dysfunction due to arterial insufficiency: Secondary | ICD-10-CM | POA: Diagnosis not present

## 2021-12-21 DIAGNOSIS — R351 Nocturia: Secondary | ICD-10-CM | POA: Diagnosis not present

## 2021-12-21 DIAGNOSIS — N401 Enlarged prostate with lower urinary tract symptoms: Secondary | ICD-10-CM | POA: Diagnosis not present

## 2021-12-21 DIAGNOSIS — E291 Testicular hypofunction: Secondary | ICD-10-CM | POA: Diagnosis not present

## 2021-12-21 DIAGNOSIS — N138 Other obstructive and reflux uropathy: Secondary | ICD-10-CM

## 2021-12-21 NOTE — Progress Notes (Signed)
12/21/2021 11:28 AM   Jonathan Gaines Jan 14, 1956 [id number]  Referring provider: Glenda Chroman, MD [employer] [place],  [place] [address]  Followup BPH and erectile dysfunction    HPI: Mr Jonathan Gaines is a 66yo here for followup for BPH with nocturia and erectile dysfunction. IPSS 13 QOl 2 on uroxatral. Nocturia 0-2x depending on fluid management. Urine stream strong, no straining to urinate. No other urinary complaints. Testosterone 169. PSA 0.3. He has difficutly both getting and maintainign his erection for over 18 months. He has decreased libido   PMH: Past Medical History:  Diagnosis Date   Acute renal failure ([facility])    Atrial flutter ([facility])    CHADS: 1   Hyperlipidemia    Hypertension    Hypothyroidism    Valvular heart disease    Mild MR, TR, echo, 07/2012    Surgical History: Past Surgical History:  Procedure Laterality Date   CERVICAL SPINE SURGERY     KNEE SURGERY      Home Medications:  Allergies as of 12/21/2021       Reactions   Sulfa Antibiotics Rash        Medication List        Accurate as of December 21, 2021 11:28 AM. If you have any questions, ask your nurse or doctor.          alfuzosin 10 MG 24 hr tablet Commonly known as: UROXATRAL Take 1 tablet (10 mg total) by mouth at bedtime.   aspirin EC 81 MG tablet Take 81 mg by mouth daily.   atenolol 50 MG tablet Commonly known as: TENORMIN Take 50 mg by mouth daily.   diclofenac 75 MG EC tablet Commonly known as: VOLTAREN Take 75 mg by mouth 2 (two) times daily.   fenofibrate 160 MG tablet Take 160 mg by mouth daily.   HYDROcodone-acetaminophen 5-325 MG tablet Commonly known as: NORCO/VICODIN Take 1 tablet by mouth as needed.   levothyroxine 150 MCG tablet Commonly known as: SYNTHROID Take 150 mcg by mouth daily before breakfast.   pravastatin 40 MG tablet Commonly known as: PRAVACHOL Take 40 mg by mouth daily.   Zestoretic 10-12.5 MG tablet Generic drug:  lisinopril-hydrochlorothiazide Take 2 tablets by mouth daily.        Allergies:  Allergies  Allergen Reactions   Sulfa Antibiotics Rash    Family History: Family History  Problem Relation Age of Onset   Hypertension Other     Social History:  reports that he quit smoking about 11 years ago. His smoking use included cigarettes. He started smoking about 49 years ago. He has a 35.00 pack-year smoking history. He quit smokeless tobacco use about 33 years ago.  His smokeless tobacco use included chew. No history on file for alcohol use and drug use.  ROS: All other review of systems were reviewed and are negative except what is noted above in HPI  Physical Exam: BP (!) 149/77   Pulse 71   Constitutional:  Alert and oriented, No acute distress. HEENT: [place] AT, moist mucus membranes.  Trachea midline, no masses. Cardiovascular: No clubbing, cyanosis, or edema. Respiratory: Normal respiratory effort, no increased work of breathing. GI: Abdomen is soft, nontender, nondistended, no abdominal masses GU: No CVA tenderness.  Lymph: No cervical or inguinal lymphadenopathy. Skin: No rashes, bruises or suspicious lesions. Neurologic: Grossly intact, no focal deficits, moving all 4 extremities. Psychiatric: Normal mood and affect.  Laboratory Data: No results found for: "WBC", "HGB", "HCT", "MCV", "PLT"  No  results found for: "CREATININE"  No results found for: "PSA"  No results found for: "TESTOSTERONE"  No results found for: "HGBA1C"  Urinalysis    Component Value Date/Time   APPEARANCEUR Clear 11/14/2021 1505   GLUCOSEU Negative 11/14/2021 1505   BILIRUBINUR Negative 11/14/2021 1505   PROTEINUR Negative 11/14/2021 1505   NITRITE Negative 11/14/2021 1505   LEUKOCYTESUR Trace (A) 11/14/2021 1505    Lab Results  Component Value Date   LABMICR See below: 11/14/2021   WBCUA 0-5 11/14/2021   LABEPIT 0-10 11/14/2021   MUCUS Present 11/14/2021   BACTERIA None seen 11/14/2021     Pertinent Imaging:  No results found for this or any previous visit.  No results found for this or any previous visit.  No results found for this or any previous visit.  No results found for this or any previous visit.  No results found for this or any previous visit.  No results found for this or any previous visit.  No results found for this or any previous visit.  No results found for this or any previous visit.   Assessment & Plan:    1. Nocturia Continue uroxatral '10mg'$  - Urinalysis, Routine w reflex microscopic  2. Erectile dysfunction due to arterial insufficiency -we will obtain testosterone labs prior to initiating therapy for hypogonadism and erectile dysfunction    No follow-ups on file.  Nicolette Bang, MD  [facility] [place]

## 2021-12-22 LAB — URINALYSIS, ROUTINE W REFLEX MICROSCOPIC
Bilirubin, UA: NEGATIVE
Ketones, UA: NEGATIVE
Leukocytes,UA: NEGATIVE
Nitrite, UA: NEGATIVE
Protein,UA: NEGATIVE
Specific Gravity, UA: 1.01 (ref 1.005–1.030)
Urobilinogen, Ur: 0.2 mg/dL (ref 0.2–1.0)
pH, UA: 6 (ref 5.0–7.5)

## 2021-12-22 LAB — CBC WITH DIFFERENTIAL
Basophils Absolute: 0 10*3/uL (ref 0.0–0.2)
Basos: 1 %
EOS (ABSOLUTE): 0.2 10*3/uL (ref 0.0–0.4)
Eos: 4 %
Hematocrit: 42.8 % (ref 37.5–51.0)
Hemoglobin: 14.2 g/dL (ref 13.0–17.7)
Immature Grans (Abs): 0 10*3/uL (ref 0.0–0.1)
Immature Granulocytes: 0 %
Lymphocytes Absolute: 1.8 10*3/uL (ref 0.7–3.1)
Lymphs: 29 %
MCH: 30.1 pg (ref 26.6–33.0)
MCHC: 33.2 g/dL (ref 31.5–35.7)
MCV: 91 fL (ref 79–97)
Monocytes Absolute: 0.7 10*3/uL (ref 0.1–0.9)
Monocytes: 11 %
Neutrophils Absolute: 3.5 10*3/uL (ref 1.4–7.0)
Neutrophils: 55 %
RBC: 4.72 x10E6/uL (ref 4.14–5.80)
RDW: 13.6 % (ref 11.6–15.4)
WBC: 6.3 10*3/uL (ref 3.4–10.8)

## 2021-12-22 LAB — COMPREHENSIVE METABOLIC PANEL
ALT: 26 IU/L (ref 0–44)
AST: 21 IU/L (ref 0–40)
Albumin/Globulin Ratio: 2 (ref 1.2–2.2)
Albumin: 4.8 g/dL (ref 3.9–4.9)
Alkaline Phosphatase: 42 IU/L — ABNORMAL LOW (ref 44–121)
BUN/Creatinine Ratio: 16 (ref 10–24)
BUN: 26 mg/dL (ref 8–27)
Bilirubin Total: 0.3 mg/dL (ref 0.0–1.2)
CO2: 21 mmol/L (ref 20–29)
Calcium: 9.9 mg/dL (ref 8.6–10.2)
Chloride: 102 mmol/L (ref 96–106)
Creatinine, Ser: 1.6 mg/dL — ABNORMAL HIGH (ref 0.76–1.27)
Globulin, Total: 2.4 g/dL (ref 1.5–4.5)
Glucose: 90 mg/dL (ref 70–99)
Potassium: 4.8 mmol/L (ref 3.5–5.2)
Sodium: 139 mmol/L (ref 134–144)
Total Protein: 7.2 g/dL (ref 6.0–8.5)
eGFR: 47 mL/min/{1.73_m2} — ABNORMAL LOW (ref >59)

## 2021-12-22 LAB — ESTRADIOL: Estradiol: 13.5 pg/mL (ref 7.6–42.6)

## 2021-12-22 LAB — PROLACTIN: Prolactin: 13.6 ng/mL (ref 4.0–15.2)

## 2021-12-22 LAB — MICROSCOPIC EXAMINATION
Epithelial Cells (non renal): NONE SEEN /hpf (ref 0–10)
Renal Epithel, UA: NONE SEEN /hpf
WBC, UA: NONE SEEN /hpf (ref 0–5)

## 2021-12-23 LAB — TESTOSTERONE,FREE AND TOTAL
Testosterone, Free: 4.8 pg/mL — ABNORMAL LOW (ref 6.6–18.1)
Testosterone: 169 ng/dL — ABNORMAL LOW (ref 264–916)

## 2021-12-23 LAB — PSA: Prostate Specific Ag, Serum: 0.3 ng/mL (ref 0.0–4.0)

## 2021-12-28 ENCOUNTER — Telehealth: Payer: Self-pay

## 2021-12-28 NOTE — Telephone Encounter (Signed)
Message sent to MD

## 2021-12-29 ENCOUNTER — Encounter: Payer: Self-pay | Admitting: Urology

## 2021-12-29 NOTE — Patient Instructions (Signed)
Erectile Dysfunction ?Erectile dysfunction (ED) is the inability to get or keep an erection in order to have sexual intercourse. ED is considered a symptom of an underlying disorder and is not considered a disease. ED may include: ?Inability to get an erection. ?Lack of enough hardness of the erection to allow penetration. ?Loss of erection before sex is finished. ?What are the causes? ?This condition may be caused by: ?Physical causes, such as: ?Artery problems. This may include heart disease, high blood pressure, atherosclerosis, and diabetes. ?Hormonal problems, such as low testosterone. ?Obesity. ?Nerve problems. This may include back or pelvic injuries, multiple sclerosis, Parkinson's disease, spinal cord injury, and stroke. ?Certain medicines, such as: ?Pain relievers. ?Antidepressants. ?Blood pressure medicines and water pills (diuretics). ?Cancer medicines. ?Antihistamines. ?Muscle relaxants. ?Lifestyle factors, such as: ?Use of drugs such as marijuana, cocaine, or opioids. ?Excessive use of alcohol. ?Smoking. ?Lack of physical activity or exercise. ?Psychological causes, such as: ?Anxiety or stress. ?Sadness or depression. ?Exhaustion. ?Fear about sexual performance. ?Guilt. ?What are the signs or symptoms? ?Symptoms of this condition include: ?Inability to get an erection. ?Lack of enough hardness of the erection to allow penetration. ?Loss of the erection before sex is finished. ?Sometimes having normal erections, but with frequent unsatisfactory episodes. ?Low sexual satisfaction in either partner due to erection problems. ?A curved penis occurring with erection. The curve may cause pain, or the penis may be too curved to allow for intercourse. ?Never having nighttime or morning erections. ?How is this diagnosed? ?This condition is often diagnosed by: ?Performing a physical exam to find other diseases or specific problems with the penis. ?Asking you detailed questions about the problem. ?Doing tests,  such as: ?Blood tests to check for diabetes mellitus or high cholesterol, or to measure hormone levels. ?Other tests to check for underlying health conditions. ?An ultrasound exam to check for scarring. ?A test to check blood flow to the penis. ?Doing a sleep study at home to measure nighttime erections. ?How is this treated? ?This condition may be treated by: ?Medicines, such as: ?Medicine taken by mouth to help you achieve an erection (oral medicine). ?Hormone replacement therapy to replace low testosterone levels. ?Medicine that is injected into the penis. Your health care provider may instruct you how to give yourself these injections at home. ?Medicine that is delivered with a short applicator tube. The tube is inserted into the opening at the tip of the penis, which is the opening of the urethra. A tiny pellet of medicine is put in the urethra. The pellet dissolves and enhances erectile function. This is also called MUSE (medicated urethral system for erections) therapy. ?Vacuum pump. This is a pump with a ring on it. The pump and ring are placed on the penis and used to create pressure that helps the penis become erect. ?Penile implant surgery. In this procedure, you may receive: ?An inflatable implant. This consists of cylinders, a pump, and a reservoir. The cylinders can be inflated with a fluid that helps to create an erection, and they can be deflated after intercourse. ?A semi-rigid implant. This consists of two silicone rubber rods. The rods provide some rigidity. They are also flexible, so the penis can both curve downward in its normal position and become straight for sexual intercourse. ?Blood vessel surgery to improve blood flow to the penis. During this procedure, a blood vessel from a different part of the body is placed into the penis to allow blood to flow around (bypass) damaged or blocked blood vessels. ?Lifestyle changes,   such as exercising more, losing weight, and quitting smoking. ?Follow  these instructions at home: ?Medicines ? ?Take over-the-counter and prescription medicines only as told by your health care provider. Do not increase the dosage without first discussing it with your health care provider. ?If you are using self-injections, do injections as directed by your health care provider. Make sure you avoid any veins that are on the surface of the penis. After giving an injection, apply pressure to the injection site for 5 minutes. ?Talk to your health care provider about how to prevent headaches while taking ED medicines. These medicines may cause a sudden headache due to the increase in blood flow in your body. ?General instructions ?Exercise regularly, as directed by your health care provider. Work with your health care provider to lose weight, if needed. ?Do not use any products that contain nicotine or tobacco. These products include cigarettes, chewing tobacco, and vaping devices, such as e-cigarettes. If you need help quitting, ask your health care provider. ?Before using a vacuum pump, read the instructions that come with the pump and discuss any questions with your health care provider. ?Keep all follow-up visits. This is important. ?Contact a health care provider if: ?You feel nauseous. ?You are vomiting. ?You get sudden headaches while taking ED medicines. ?You have any concerns about your sexual health. ?Get help right away if: ?You are taking oral or injectable medicines and you have an erection that lasts longer than 4 hours. If your health care provider is unavailable, go to the nearest emergency room for evaluation. An erection that lasts much longer than 4 hours can result in permanent damage to your penis. ?You have severe pain in your groin or abdomen. ?You develop redness or severe swelling of your penis. ?You have redness spreading at your groin or lower abdomen. ?You are unable to urinate. ?You experience chest pain or a rapid heartbeat (palpitations) after taking oral  medicines. ?These symptoms may represent a serious problem that is an emergency. Do not wait to see if the symptoms will go away. Get medical help right away. Call your local emergency services (911 in the [place] Do not drive yourself to the hospital. ?Summary ?Erectile dysfunction (ED) is the inability to get or keep an erection during sexual intercourse. ?This condition is diagnosed based on a physical exam, your symptoms, and tests to determine the cause. Treatment varies depending on the cause and may include medicines, hormone therapy, surgery, or a vacuum pump. ?You may need follow-up visits to make sure that you are using your medicines or devices correctly. ?Get help right away if you are taking or injecting medicines and you have an erection that lasts longer than 4 hours. ?This information is not intended to replace advice given to you by your health care provider. Make sure you discuss any questions you have with your health care provider. ?Document Revised: 07/14/2020 Document Reviewed: 07/14/2020 ?Elsevier Patient Education ? 2023 [employer] ? ?

## 2022-01-03 ENCOUNTER — Telehealth: Payer: Self-pay

## 2022-01-03 MED ORDER — CLOMID 50 MG PO TABS
25.0000 mg | ORAL_TABLET | Freq: Every day | ORAL | 11 refills | Status: DC
Start: 2022-01-03 — End: 2022-03-27

## 2022-01-03 NOTE — Telephone Encounter (Signed)
Patient aware of results and rx sent to pharmacy.

## 2022-01-03 NOTE — Telephone Encounter (Signed)
-----   Message from Cleon Gustin, MD sent at 01/03/2022  9:52 AM EDT ----- Please send in clomid '25mg'$  daily ----- Message ----- From: Audie Box, CMA Sent: 12/23/2021  11:12 AM EDT To: Cleon Gustin, MD  Please review, f/u 11/27

## 2022-03-20 ENCOUNTER — Other Ambulatory Visit: Payer: Medicare Other

## 2022-03-23 LAB — COMPREHENSIVE METABOLIC PANEL
ALT: 21 IU/L (ref 0–44)
AST: 12 IU/L (ref 0–40)
Albumin/Globulin Ratio: 2 (ref 1.2–2.2)
Albumin: 4.3 g/dL (ref 3.9–4.9)
Alkaline Phosphatase: 31 IU/L — ABNORMAL LOW (ref 44–121)
BUN/Creatinine Ratio: 22 (ref 10–24)
BUN: 39 mg/dL — ABNORMAL HIGH (ref 8–27)
Bilirubin Total: 0.2 mg/dL (ref 0.0–1.2)
CO2: 22 mmol/L (ref 20–29)
Calcium: 9.5 mg/dL (ref 8.6–10.2)
Chloride: 103 mmol/L (ref 96–106)
Creatinine, Ser: 1.8 mg/dL — ABNORMAL HIGH (ref 0.76–1.27)
Globulin, Total: 2.2 g/dL (ref 1.5–4.5)
Glucose: 111 mg/dL — ABNORMAL HIGH (ref 70–99)
Potassium: 4.8 mmol/L (ref 3.5–5.2)
Sodium: 139 mmol/L (ref 134–144)
Total Protein: 6.5 g/dL (ref 6.0–8.5)
eGFR: 41 mL/min/{1.73_m2} — ABNORMAL LOW (ref >59)

## 2022-03-23 LAB — CBC WITH DIFFERENTIAL
Basophils Absolute: 0 x10E3/uL (ref 0.0–0.2)
Basos: 0 %
EOS (ABSOLUTE): 0 x10E3/uL (ref 0.0–0.4)
Eos: 1 %
Hematocrit: 39.4 % (ref 37.5–51.0)
Hemoglobin: 13.2 g/dL (ref 13.0–17.7)
Immature Grans (Abs): 0 x10E3/uL (ref 0.0–0.1)
Immature Granulocytes: 0 %
Lymphocytes Absolute: 1.5 x10E3/uL (ref 0.7–3.1)
Lymphs: 20 %
MCH: 30.6 pg (ref 26.6–33.0)
MCHC: 33.5 g/dL (ref 31.5–35.7)
MCV: 91 fL (ref 79–97)
Monocytes Absolute: 0.6 x10E3/uL (ref 0.1–0.9)
Monocytes: 8 %
Neutrophils Absolute: 5.4 x10E3/uL (ref 1.4–7.0)
Neutrophils: 71 %
RBC: 4.31 x10E6/uL (ref 4.14–5.80)
RDW: 13 % (ref 11.6–15.4)
WBC: 7.6 x10E3/uL (ref 3.4–10.8)

## 2022-03-23 LAB — TESTOSTERONE,FREE AND TOTAL
Testosterone, Free: 7.1 pg/mL (ref 6.6–18.1)
Testosterone: 354 ng/dL (ref 264–916)

## 2022-03-27 ENCOUNTER — Ambulatory Visit: Payer: Medicare Other | Admitting: Urology

## 2022-03-27 ENCOUNTER — Encounter: Payer: Self-pay | Admitting: Urology

## 2022-03-27 VITALS — BP 129/82 | HR 73 | Ht 71.0 in | Wt 205.0 lb

## 2022-03-27 DIAGNOSIS — N5201 Erectile dysfunction due to arterial insufficiency: Secondary | ICD-10-CM

## 2022-03-27 DIAGNOSIS — R351 Nocturia: Secondary | ICD-10-CM | POA: Diagnosis not present

## 2022-03-27 DIAGNOSIS — E291 Testicular hypofunction: Secondary | ICD-10-CM

## 2022-03-27 DIAGNOSIS — N138 Other obstructive and reflux uropathy: Secondary | ICD-10-CM

## 2022-03-27 DIAGNOSIS — N401 Enlarged prostate with lower urinary tract symptoms: Secondary | ICD-10-CM

## 2022-03-27 LAB — URINALYSIS, ROUTINE W REFLEX MICROSCOPIC
Bilirubin, UA: NEGATIVE
Glucose, UA: NEGATIVE
Ketones, UA: NEGATIVE
Leukocytes,UA: NEGATIVE
Nitrite, UA: NEGATIVE
Specific Gravity, UA: 1.02 (ref 1.005–1.030)
Urobilinogen, Ur: 0.2 mg/dL (ref 0.2–1.0)
pH, UA: 5.5 (ref 5.0–7.5)

## 2022-03-27 LAB — MICROSCOPIC EXAMINATION
Bacteria, UA: NONE SEEN
WBC, UA: NONE SEEN /hpf (ref 0–5)

## 2022-03-27 MED ORDER — TADALAFIL 20 MG PO TABS: 20.0000 mg | ORAL_TABLET | ORAL | 5 refills | Status: AC | PRN

## 2022-03-27 MED ORDER — ALFUZOSIN HCL ER 10 MG PO TB24: 10.0000 mg | ORAL_TABLET | Freq: Every day | ORAL | 11 refills | Status: DC

## 2022-03-27 MED ORDER — CLOMID 50 MG PO TABS: 25.0000 mg | ORAL_TABLET | Freq: Every day | ORAL | 11 refills | Status: AC

## 2022-03-27 NOTE — Progress Notes (Signed)
03/27/2022 10:37 AM   [name] 1956/03/01 [id number]  Referring provider: Glenda Chroman, MD [address] [place],  [place] [address]  Followup hypogonadism and BPH   HPI: Mr Bohorquez is a 66yo here for followup for hypogonadism, BPH and erectile dysfunction. IPSS 12 QOL 2 on uroxatral '10mg'$  qhs. Nocturia 1x. Urine stream strong. No straining to urinate. Testosterone 350, hgb 13.2 on clomid '25mg'$  daily. Energy improved, libido improved. He uses tadalafil '20mg'$  prn with good results   PMH: Past Medical History:  Diagnosis Date   Acute renal failure ([place])    Atrial flutter ([facility])    CHADS: 1   Hyperlipidemia    Hypertension    Hypothyroidism    Valvular heart disease    Mild MR, TR, echo, 07/2012    Surgical History: Past Surgical History:  Procedure Laterality Date   CERVICAL SPINE SURGERY     KNEE SURGERY      Home Medications:  Allergies as of 03/27/2022       Reactions   Sulfa Antibiotics Rash        Medication List        Accurate as of March 27, 2022 10:37 AM. If you have any questions, ask your nurse or doctor.          alfuzosin 10 MG 24 hr tablet Commonly known as: UROXATRAL Take 1 tablet (10 mg total) by mouth at bedtime.   aspirin EC 81 MG tablet Take 81 mg by mouth daily.   atenolol 50 MG tablet Commonly known as: TENORMIN Take 50 mg by mouth daily.   Clomid 50 MG tablet Generic drug: clomiPHENE Take 0.5 tablets (25 mg total) by mouth daily.   diclofenac 75 MG EC tablet Commonly known as: VOLTAREN Take 75 mg by mouth 2 (two) times daily.   fenofibrate 160 MG tablet Take 160 mg by mouth daily.   HYDROcodone-acetaminophen 5-325 MG tablet Commonly known as: NORCO/VICODIN Take 1 tablet by mouth as needed.   levothyroxine 150 MCG tablet Commonly known as: SYNTHROID Take 150 mcg by mouth daily before breakfast.   pravastatin 40 MG tablet Commonly known as: PRAVACHOL Take 40 mg by mouth daily.   Zestoretic 10-12.5 MG  tablet Generic drug: lisinopril-hydrochlorothiazide Take 2 tablets by mouth daily.        Allergies:  Allergies  Allergen Reactions   Sulfa Antibiotics Rash    Family History: Family History  Problem Relation Age of Onset   Hypertension Other     Social History:  reports that he quit smoking about 11 years ago. His smoking use included cigarettes. He started smoking about 49 years ago. He has a 35.00 pack-year smoking history. He quit smokeless tobacco use about 33 years ago.  His smokeless tobacco use included chew. No history on file for alcohol use and drug use.  ROS: All other review of systems were reviewed and are negative except what is noted above in HPI  Physical Exam: BP 129/82   Pulse 73   Ht '5\' 11"'$  (1.803 m)   Wt 205 lb (93 kg)   BMI 28.59 kg/m   Constitutional:  Alert and oriented, No acute distress. HEENT: [place] AT, moist mucus membranes.  Trachea midline, no masses. Cardiovascular: No clubbing, cyanosis, or edema. Respiratory: Normal respiratory effort, no increased work of breathing. GI: Abdomen is soft, nontender, nondistended, no abdominal masses GU: No CVA tenderness.  Lymph: No cervical or inguinal lymphadenopathy. Skin: No rashes, bruises or suspicious lesions. Neurologic: Grossly intact,  no focal deficits, moving all 4 extremities. Psychiatric: Normal mood and affect.  Laboratory Data: Lab Results  Component Value Date   WBC 7.6 03/20/2022   HGB 13.2 03/20/2022   HCT 39.4 03/20/2022   MCV 91 03/20/2022    Lab Results  Component Value Date   CREATININE 1.80 (H) 03/20/2022    No results found for: "PSA"  Lab Results  Component Value Date   TESTOSTERONE 354 03/20/2022    No results found for: "HGBA1C"  Urinalysis    Component Value Date/Time   APPEARANCEUR Clear 12/21/2021 1055   GLUCOSEU Trace (A) 12/21/2021 1055   BILIRUBINUR Negative 12/21/2021 1055   PROTEINUR Negative 12/21/2021 1055   NITRITE Negative 12/21/2021 1055    LEUKOCYTESUR Negative 12/21/2021 1055    Lab Results  Component Value Date   LABMICR See below: 12/21/2021   WBCUA None seen 12/21/2021   LABEPIT None seen 12/21/2021   MUCUS Present 12/21/2021   BACTERIA Few 12/21/2021    Pertinent Imaging:  No results found for this or any previous visit.  No results found for this or any previous visit.  No results found for this or any previous visit.  No results found for this or any previous visit.  No results found for this or any previous visit.  No valid procedures specified. No results found for this or any previous visit.  No results found for this or any previous visit.   Assessment & Plan:    1. Nocturia -Cotninue uroxatral '10mg'$  qhs - Urinalysis, Routine w reflex microscopic  2. Erectile dysfunction due to arterial insufficiency -tadalafil '20mg'$  prn  3. Benign prostatic hyperplasia with urinary obstruction -uroxatral '10mg'$  qhs  4. Hypogonadism in male Continue clomid '25mg'$  daily -Followup 3 months with testosterone labs  No follow-ups on file.  Nicolette Bang, MD  [facility] [place]

## 2022-03-27 NOTE — Patient Instructions (Signed)
Erectile Dysfunction ?Erectile dysfunction (ED) is the inability to get or keep an erection in order to have sexual intercourse. ED is considered a symptom of an underlying disorder and is not considered a disease. ED may include: ?Inability to get an erection. ?Lack of enough hardness of the erection to allow penetration. ?Loss of erection before sex is finished. ?What are the causes? ?This condition may be caused by: ?Physical causes, such as: ?Artery problems. This may include heart disease, high blood pressure, atherosclerosis, and diabetes. ?Hormonal problems, such as low testosterone. ?Obesity. ?Nerve problems. This may include back or pelvic injuries, multiple sclerosis, Parkinson's disease, spinal cord injury, and stroke. ?Certain medicines, such as: ?Pain relievers. ?Antidepressants. ?Blood pressure medicines and water pills (diuretics). ?Cancer medicines. ?Antihistamines. ?Muscle relaxants. ?Lifestyle factors, such as: ?Use of drugs such as marijuana, cocaine, or opioids. ?Excessive use of alcohol. ?Smoking. ?Lack of physical activity or exercise. ?Psychological causes, such as: ?Anxiety or stress. ?Sadness or depression. ?Exhaustion. ?Fear about sexual performance. ?Guilt. ?What are the signs or symptoms? ?Symptoms of this condition include: ?Inability to get an erection. ?Lack of enough hardness of the erection to allow penetration. ?Loss of the erection before sex is finished. ?Sometimes having normal erections, but with frequent unsatisfactory episodes. ?Low sexual satisfaction in either partner due to erection problems. ?A curved penis occurring with erection. The curve may cause pain, or the penis may be too curved to allow for intercourse. ?Never having nighttime or morning erections. ?How is this diagnosed? ?This condition is often diagnosed by: ?Performing a physical exam to find other diseases or specific problems with the penis. ?Asking you detailed questions about the problem. ?Doing tests,  such as: ?Blood tests to check for diabetes mellitus or high cholesterol, or to measure hormone levels. ?Other tests to check for underlying health conditions. ?An ultrasound exam to check for scarring. ?A test to check blood flow to the penis. ?Doing a sleep study at home to measure nighttime erections. ?How is this treated? ?This condition may be treated by: ?Medicines, such as: ?Medicine taken by mouth to help you achieve an erection (oral medicine). ?Hormone replacement therapy to replace low testosterone levels. ?Medicine that is injected into the penis. Your health care provider may instruct you how to give yourself these injections at home. ?Medicine that is delivered with a short applicator tube. The tube is inserted into the opening at the tip of the penis, which is the opening of the urethra. A tiny pellet of medicine is put in the urethra. The pellet dissolves and enhances erectile function. This is also called MUSE (medicated urethral system for erections) therapy. ?Vacuum pump. This is a pump with a ring on it. The pump and ring are placed on the penis and used to create pressure that helps the penis become erect. ?Penile implant surgery. In this procedure, you may receive: ?An inflatable implant. This consists of cylinders, a pump, and a reservoir. The cylinders can be inflated with a fluid that helps to create an erection, and they can be deflated after intercourse. ?A semi-rigid implant. This consists of two silicone rubber rods. The rods provide some rigidity. They are also flexible, so the penis can both curve downward in its normal position and become straight for sexual intercourse. ?Blood vessel surgery to improve blood flow to the penis. During this procedure, a blood vessel from a different part of the body is placed into the penis to allow blood to flow around (bypass) damaged or blocked blood vessels. ?Lifestyle changes,   such as exercising more, losing weight, and quitting smoking. ?Follow  these instructions at home: ?Medicines ? ?Take over-the-counter and prescription medicines only as told by your health care provider. Do not increase the dosage without first discussing it with your health care provider. ?If you are using self-injections, do injections as directed by your health care provider. Make sure you avoid any veins that are on the surface of the penis. After giving an injection, apply pressure to the injection site for 5 minutes. ?Talk to your health care provider about how to prevent headaches while taking ED medicines. These medicines may cause a sudden headache due to the increase in blood flow in your body. ?General instructions ?Exercise regularly, as directed by your health care provider. Work with your health care provider to lose weight, if needed. ?Do not use any products that contain nicotine or tobacco. These products include cigarettes, chewing tobacco, and vaping devices, such as e-cigarettes. If you need help quitting, ask your health care provider. ?Before using a vacuum pump, read the instructions that come with the pump and discuss any questions with your health care provider. ?Keep all follow-up visits. This is important. ?Contact a health care provider if: ?You feel nauseous. ?You are vomiting. ?You get sudden headaches while taking ED medicines. ?You have any concerns about your sexual health. ?Get help right away if: ?You are taking oral or injectable medicines and you have an erection that lasts longer than 4 hours. If your health care provider is unavailable, go to the nearest emergency room for evaluation. An erection that lasts much longer than 4 hours can result in permanent damage to your penis. ?You have severe pain in your groin or abdomen. ?You develop redness or severe swelling of your penis. ?You have redness spreading at your groin or lower abdomen. ?You are unable to urinate. ?You experience chest pain or a rapid heartbeat (palpitations) after taking oral  medicines. ?These symptoms may represent a serious problem that is an emergency. Do not wait to see if the symptoms will go away. Get medical help right away. Call your local emergency services (911 in the [place] Do not drive yourself to the hospital. ?Summary ?Erectile dysfunction (ED) is the inability to get or keep an erection during sexual intercourse. ?This condition is diagnosed based on a physical exam, your symptoms, and tests to determine the cause. Treatment varies depending on the cause and may include medicines, hormone therapy, surgery, or a vacuum pump. ?You may need follow-up visits to make sure that you are using your medicines or devices correctly. ?Get help right away if you are taking or injecting medicines and you have an erection that lasts longer than 4 hours. ?This information is not intended to replace advice given to you by your health care provider. Make sure you discuss any questions you have with your health care provider. ?Document Revised: 07/14/2020 Document Reviewed: 07/14/2020 ?Elsevier Patient Education ? 2023 [employer] ? ?

## 2022-04-28 ENCOUNTER — Encounter: Payer: Self-pay | Admitting: Internal Medicine

## 2022-04-28 ENCOUNTER — Ambulatory Visit: Payer: Medicare Other | Attending: Internal Medicine | Admitting: Internal Medicine

## 2022-04-28 ENCOUNTER — Ambulatory Visit (HOSPITAL_COMMUNITY)
Admission: RE | Admit: 2022-04-28 | Discharge: 2022-04-28 | Disposition: A | Discharge status: Home / Self Care | Payer: Medicare Other | Source: Ambulatory Visit | Attending: Internal Medicine | Admitting: Internal Medicine

## 2022-04-28 VITALS — BP 140/88 | Ht 71.0 in | Wt 200.6 lb

## 2022-04-28 DIAGNOSIS — R0609 Other forms of dyspnea: Secondary | ICD-10-CM | POA: Diagnosis not present

## 2022-04-28 DIAGNOSIS — R079 Chest pain, unspecified: Secondary | ICD-10-CM | POA: Diagnosis not present

## 2022-04-28 DIAGNOSIS — R0602 Shortness of breath: Secondary | ICD-10-CM | POA: Insufficient documentation

## 2022-04-28 DIAGNOSIS — R072 Precordial pain: Secondary | ICD-10-CM | POA: Insufficient documentation

## 2022-04-28 MED ORDER — METOPROLOL TARTRATE 50 MG PO TABS: ORAL_TABLET | ORAL | 0 refills | Status: AC

## 2022-04-28 NOTE — Progress Notes (Signed)
Cardiology Office Note  Date: 04/28/2022   ID: Jonathan Gaines, DOB 1955/08/08, MRN [id number]  PCP:  Glenda Chroman, MD  Cardiologist:  None Electrophysiologist:  None   Reason for Office Visit: Evaluation of SOB at the request of Dr. Woody Seller   History of Present Illness: Jonathan Gaines is a 66 y.o. male known to have HTN, HLD, history of atrial flutter/A-fib in 2014 (no EKG evidence) was referred to cardiology clinic for evaluation of SOB.  Patient has been having DOE on random occasions for the last 6 months to 1 year (for example, taking out trash from his house to the trash bin and making it back to the house is making him feel SOB. Associated with dizziness on some occasions). No leg swelling.  He has occasional dizziness when his blood pressure was checked and was noted to be in 60s SBP. His blood pressure medication doses were decreased by his PCP but the patient noticed his BP elevated after that. He started to take his original doses of antihypertensive medications with control of blood pressures. He also takes NSAIDs for lower back pain.  He he also reported having substernal chest discomfort lasting for 20 minutes, occurring once in a few months and thought it could be from indigestion.  He has hiatal hernia for which his family doctor recommended no surgical intervention unless he has severe symptoms.  Denied smoking cigarettes, alcohol use or illicit drug abuse.  No family history of premature ASCVD.  No prior history of CAD/MI/PCI/CABG.  He said he was diagnosed with atrial flutter/A-fib in 2014 but cannot recall the doctor who diagnosed him at [facility].  Will try to obtain records. His CHA2DS2-VASc 2 score was 1 at that time and was not placed on any AC.  Past Medical History:  Diagnosis Date   Acute renal failure ([facility])    Atrial flutter ([facility])    CHADS: 1   Hyperlipidemia    Hypertension    Hypothyroidism    Valvular heart disease    Mild MR, TR, echo, 07/2012     Past Surgical History:  Procedure Laterality Date   CERVICAL SPINE SURGERY     KNEE SURGERY      Current Outpatient Medications  Medication Sig Dispense Refill   aspirin EC 81 MG tablet Take 81 mg by mouth daily.     atenolol (TENORMIN) 50 MG tablet Take 50 mg by mouth daily.     clomiPHENE (CLOMID) 50 MG tablet Take 0.5 tablets (25 mg total) by mouth daily. 30 tablet 11   diclofenac (VOLTAREN) 75 MG EC tablet Take 75 mg by mouth 2 (two) times daily.     fenofibrate 160 MG tablet Take 160 mg by mouth daily.     HYDROcodone-acetaminophen (NORCO/VICODIN) 5-325 MG per tablet Take 1 tablet by mouth as needed.     levothyroxine (SYNTHROID, LEVOTHROID) 150 MCG tablet Take 150 mcg by mouth daily before breakfast.     lisinopril-hydrochlorothiazide (ZESTORETIC) 10-12.5 MG per tablet Take 2 tablets by mouth daily.      pravastatin (PRAVACHOL) 40 MG tablet Take 40 mg by mouth daily.     tadalafil (CIALIS) 20 MG tablet Take 1 tablet (20 mg total) by mouth as needed. 10 tablet 5   No current facility-administered medications for this visit.   Allergies:  Sulfa antibiotics   Social History: The patient  reports that he quit smoking about 12 years ago. His smoking use included cigarettes. He started smoking about 50  years ago. He has a 35.00 pack-year smoking history. He quit smokeless tobacco use about 34 years ago.  His smokeless tobacco use included chew.   Family History: The patient's family history includes Hypertension in an other family member.   ROS:  Please see the history of present illness. Otherwise, complete review of systems is positive for none.  All other systems are reviewed and negative.   Physical Exam: VS:  Ht '5\' 11"'$  (1.803 m)   Wt 200 lb 9.6 oz (91 kg)   BMI 27.98 kg/m , BMI Body mass index is 27.98 kg/m.  Wt Readings from Last 3 Encounters:  04/28/22 200 lb 9.6 oz (91 kg)  03/27/22 205 lb (93 kg)  09/30/12 205 lb (93 kg)    General: Patient appears comfortable  at rest. HEENT: Conjunctiva and lids normal, oropharynx clear with moist mucosa. Neck: Supple, no elevated JVP or carotid bruits, no thyromegaly. Lungs: Clear to auscultation, nonlabored breathing at rest. Cardiac: Regular rate and rhythm, no S3 or significant systolic murmur, no pericardial rub. Abdomen: Soft, nontender, no hepatomegaly, bowel sounds present, no guarding or rebound. Extremities: No pitting edema, distal pulses 2+. Skin: Warm and dry. Musculoskeletal: No kyphosis. Neuropsychiatric: Alert and oriented x3, affect grossly appropriate.  ECG:  An ECG dated 04/28/2022 was personally reviewed today and demonstrated:  Normal sinus rhythm and no ST-T changes  Recent Labwork: 03/20/2022: ALT 21; AST 12; BUN 39; Creatinine, Ser 1.80; Hemoglobin 13.2; Potassium 4.8; Sodium 139  No results found for: "CHOL", "TRIG", "HDL", "CHOLHDL", "VLDL", "LDLCALC", "LDLDIRECT"  Other Studies Reviewed Today:   Assessment and Plan: Patient is a 66 year old M known to have HTN, HLD, history of atrial flutter/A-fib in 2014 (no EKG evidence) was referred to cardiology clinic for evaluation of SOB.  # DOE # Chest pain -Obtain 2D echocardiogram -Obtain CTA cardiac. No allergy to contrast dye.  # HTN, partially controlled -Continue atenolol 50 mg once daily -Continue lisinopril-HCTZ 10-12.5 mg, 2 tablets once daily -Management of HTN per PCP  # Hx of atrial flutter/A-fib in 2014 (no EKG evidence) -Obtain medical records from 2014 hospitalization including EKG, telemetry monitoring strips and cardiology notes. If he had atrial flutter/A-fib in 2014, he will benefit from [facility] due to his CHA2DS2-VASc 2 score of 2. Patient voiced understanding.  I have spent a total of 41 minutes with patient reviewing chart, EKGs, labs and examining patient as well as establishing an assessment and plan that was discussed with the patient.  > 50% of time was spent in direct patient care.     Medication  Adjustments/Labs and Tests Ordered: Current medicines are reviewed at length with the patient today.  Concerns regarding medicines are outlined above.   Tests Ordered: No orders of the defined types were placed in this encounter.   Medication Changes: No orders of the defined types were placed in this encounter.   Disposition:  Follow up prn  Signed, Marnie Fazzino Fidel Levy, MD, 04/28/2022 9:41 AM    [facility]. [address], [place], [place] [address]

## 2022-04-28 NOTE — Patient Instructions (Addendum)
Medication Instructions:  Metoprolol Tartrate 50 mg 1 tab 2 hours prior to CT Scan  *If you need a refill on your cardiac medications before your next appointment, please call your pharmacy*   Lab Work: BMET 1 week before scan   If you have labs (blood work) drawn today and your tests are completely normal, you will receive your results only by: [place] (if you have MyChart) OR A paper copy in the mail If you have any lab test that is abnormal or we need to change your treatment, we will call you to review the results.   Testing/Procedures: Your physician has requested that you have an echocardiogram. Echocardiography is a painless test that uses sound waves to create images of your heart. It provides your doctor with information about the size and shape of your heart and how well your heart's chambers and valves are working. This procedure takes approximately one hour. There are no restrictions for this procedure. Please do NOT wear cologne, perfume, aftershave, or lotions (deodorant is allowed). Please arrive 15 minutes prior to your appointment time.     Your cardiac CT will be scheduled at one of the below locations:   [facility] [address] [place], [place] [address] (336) [place] [address] [place], [place] [address] [phone]  [facility] [place], [place] [address] [phone]  If scheduled at [facility], please arrive at the [facility] Entrance (Entrance C2) of [facility] 30 minutes prior to test start time. You can use the FREE valet parking offered at entrance C (encouraged to control the heart rate for the test)  Proceed to the [facility] Radiology Department (first floor) to check-in and test prep.  All radiology patients and guests should use entrance C2 at [facility], accessed  from [facility], even though the hospital's physical address listed is [address].    If scheduled at [facility] or [facility], please arrive 15 mins early for check-in and test prep.   Please follow these instructions carefully (unless otherwise directed):  Hold all erectile dysfunction medications at least 3 days (72 hrs) prior to test. (Ie viagra, cialis, sildenafil, tadalafil, etc) We will administer nitroglycerin during this exam.   On the Night Before the Test: Be sure to Drink plenty of water. Do not consume any caffeinated/decaffeinated beverages or chocolate 12 hours prior to your test. Do not take any antihistamines 12 hours prior to your test. If the patient has contrast allergy: Patient will need a prescription for Prednisone and very clear instructions (as follows): Prednisone 50 mg - take 13 hours prior to test Take another Prednisone 50 mg 7 hours prior to test Take another Prednisone 50 mg 1 hour prior to test Take Benadryl 50 mg 1 hour prior to test Patient must complete all four doses of above prophylactic medications. Patient will need a ride after test due to Benadryl.  On the Day of the Test: Drink plenty of water until 1 hour prior to the test. Do not eat any food 1 hour prior to test. You may take your regular medications prior to the test.  Take metoprolol (Lopressor) two hours prior to test. HOLD Furosemide/Hydrochlorothiazide morning of the test. FEMALES- please wear underwire-free bra if available, avoid dresses & tight clothing   *For Clinical Staff only. Please  instruct patient the following:* Heart Rate Medication Recommendations for Cardiac CT  Resting HR < 50 bpm  No medication  Resting HR 50-60 bpm and BP >110/50 mmHG   Consider Metoprolol tartrate 25 mg PO 90-120 min prior to scan  Resting HR 60-65 bpm and BP >110/50 mmHG  Metoprolol tartrate 50 mg PO 90-120 minutes  prior to scan   Resting HR > 65 bpm and BP >110/50 mmHG  Metoprolol tartrate 100 mg PO 90-120 minutes prior to scan  Consider Ivabradine 10-15 mg PO or a calcium channel blocker for resting HR >60 bpm and contraindication to metoprolol tartrate  Consider Ivabradine 10-15 mg PO in combination with metoprolol tartrate for HR >80 bpm         After the Test: Drink plenty of water. After receiving IV contrast, you may experience a mild flushed feeling. This is normal. On occasion, you may experience a mild rash up to 24 hours after the test. This is not dangerous. If this occurs, you can take Benadryl 25 mg and increase your fluid intake. If you experience trouble breathing, this can be serious. If it is severe call 911 IMMEDIATELY. If it is mild, please call our office. If you take any of these medications: Glipizide/Metformin, Avandament, Glucavance, please do not take 48 hours after completing test unless otherwise instructed.  We will call to schedule your test 2-4 weeks out understanding that some insurance companies will need an authorization prior to the service being performed.   For non-scheduling related questions, please contact the cardiac imaging nurse navigator should you have any questions/concerns: Marchia Bond, Cardiac Imaging Nurse Navigator Gordy Clement, Cardiac Imaging Nurse Navigator [facility] Direct Office Dial: [phone]   For scheduling needs, including cancellations and rescheduling, please call [place], [phone].       Follow-Up: At [facility], you and your health needs are our priority.  As part of our continuing mission to provide you with exceptional heart care, we have created designated Provider Care Teams.  These Care Teams include your primary Cardiologist (physician) and Advanced Practice Providers (APPs -  Physician Assistants and Nurse Practitioners) who all work together to provide you with the care you  need, when you need it.  We recommend signing up for the patient portal called "MyChart".  Sign up information is provided on this After Visit Summary.  MyChart is used to connect with patients for Virtual Visits (Telemedicine).  Patients are able to view lab/test results, encounter notes, upcoming appointments, etc.  Non-urgent messages can be sent to your provider as well.   To learn more about what you can do with MyChart, go to [email or web address].    Your next appointment:   6 month(s)  The format for your next appointment:   In Person  Provider:   Claudina Lick, MD    Other Instructions   Important Information About Sugar

## 2022-04-28 NOTE — Addendum Note (Signed)
Addended by: Derrick Ravel on: 45/85/9292 10:59 AM   Modules accepted: Orders

## 2022-05-08 ENCOUNTER — Other Ambulatory Visit (HOSPITAL_COMMUNITY): Payer: Self-pay | Admitting: *Deleted

## 2022-05-08 DIAGNOSIS — R072 Precordial pain: Secondary | ICD-10-CM

## 2022-05-09 ENCOUNTER — Ambulatory Visit (HOSPITAL_COMMUNITY)
Admission: RE | Admit: 2022-05-09 | Discharge: 2022-05-09 | Disposition: A | Discharge status: Home / Self Care | Payer: Medicare Other | Source: Ambulatory Visit | Attending: Internal Medicine | Admitting: Internal Medicine

## 2022-05-09 DIAGNOSIS — R072 Precordial pain: Secondary | ICD-10-CM | POA: Diagnosis not present

## 2022-05-09 DIAGNOSIS — R0609 Other forms of dyspnea: Secondary | ICD-10-CM | POA: Diagnosis not present

## 2022-05-09 DIAGNOSIS — R0602 Shortness of breath: Secondary | ICD-10-CM

## 2022-05-09 LAB — ECHOCARDIOGRAM COMPLETE
Area-P 1/2: 3.99 cm2
S' Lateral: 2.5 cm

## 2022-05-09 NOTE — Progress Notes (Signed)
*  PRELIMINARY RESULTS* Echocardiogram 2D Echocardiogram has been performed.  Jonathan Gaines 05/09/2022, 1:40 PM

## 2022-05-12 ENCOUNTER — Telehealth: Payer: Self-pay

## 2022-05-12 NOTE — Telephone Encounter (Signed)
-----  Message from Chalmers Guest, MD sent at 05/12/2022  9:42 AM EST ----- Normal echocardiogram.

## 2022-05-12 NOTE — Telephone Encounter (Signed)
Patient notified and verbalized understanding. Patient had no questions or concerns. PCP copied.

## 2022-05-22 ENCOUNTER — Telehealth (HOSPITAL_COMMUNITY): Payer: Self-pay | Admitting: Emergency Medicine

## 2022-05-22 ENCOUNTER — Telehealth (HOSPITAL_COMMUNITY): Payer: Self-pay | Admitting: *Deleted

## 2022-05-22 NOTE — Telephone Encounter (Signed)
Patient returning call regarding upcoming cardiac imaging study; pt verbalizes understanding of appt date/time, parking situation and where to check in, pre-test NPO status and medications ordered, and verified current allergies; name and call back number provided for further questions should they arise  Gordy Clement RN Navigator Cardiac Imaging [facility] [phone] office [phone] cell  Patient to take '50mg'$  metoprolol tartrate two hours prior to his cardiac CT scan. He is aware to arrive 9:30am.

## 2022-05-22 NOTE — Telephone Encounter (Signed)
Attempted to call patient regarding upcoming cardiac CT appointment. Left message on voicemail with name and callback number Marchia Bond RN Navigator [facility] [phone] Office [phone] Cell

## 2022-05-23 ENCOUNTER — Ambulatory Visit (HOSPITAL_COMMUNITY)
Admission: RE | Admit: 2022-05-23 | Discharge: 2022-05-23 | Disposition: A | Discharge status: Home / Self Care | Payer: Medicare Other | Source: Ambulatory Visit | Attending: Internal Medicine | Admitting: Internal Medicine

## 2022-05-23 DIAGNOSIS — R072 Precordial pain: Secondary | ICD-10-CM | POA: Diagnosis not present

## 2022-05-23 MED ORDER — NITROGLYCERIN 0.4 MG SL SUBL
0.8000 mg | SUBLINGUAL_TABLET | SUBLINGUAL | Status: DC | PRN
  Administered 2022-05-23: 0.8 mg via SUBLINGUAL

## 2022-05-23 MED ORDER — IOHEXOL 350 MG/ML SOLN
100.0000 mL | Freq: Once | INTRAVENOUS | Status: AC | PRN
  Administered 2022-05-23: 100 mL via INTRAVENOUS

## 2022-05-23 MED ORDER — NITROGLYCERIN 0.4 MG SL SUBL
SUBLINGUAL_TABLET | SUBLINGUAL | Status: AC
  Filled 2022-05-23: qty 2

## 2022-05-25 ENCOUNTER — Telehealth: Payer: Self-pay

## 2022-05-25 NOTE — Telephone Encounter (Signed)
-----  Message from Chalmers Guest, MD sent at 05/24/2022  2:29 PM EST ----- Regarding: Follow-up in 1 year This patient's echocardiogram and CT cardiac is within normal limits. However I still need to receive his 2014 hospitalizations EKG, telemetry strips that showed atrial flutter/atrial fibrillation. Could you follow-up on this please. Otherwise, I will see him in 1 year.

## 2022-05-25 NOTE — Telephone Encounter (Signed)
Patient notified and verbalized understanding. Letter sent to [facility] for records.

## 2022-06-12 DIAGNOSIS — X32XXXD Exposure to sunlight, subsequent encounter: Secondary | ICD-10-CM | POA: Diagnosis not present

## 2022-06-12 DIAGNOSIS — L570 Actinic keratosis: Secondary | ICD-10-CM | POA: Diagnosis not present

## 2022-06-16 ENCOUNTER — Other Ambulatory Visit: Payer: Medicare Other

## 2022-06-16 DIAGNOSIS — E291 Testicular hypofunction: Secondary | ICD-10-CM

## 2022-06-23 ENCOUNTER — Ambulatory Visit: Payer: Medicare Other | Admitting: Urology

## 2022-06-24 LAB — COMPREHENSIVE METABOLIC PANEL
ALT: 14 IU/L (ref 0–44)
AST: 14 IU/L (ref 0–40)
Albumin/Globulin Ratio: 2 (ref 1.2–2.2)
Albumin: 4.3 g/dL (ref 3.9–4.9)
Alkaline Phosphatase: 39 IU/L — ABNORMAL LOW (ref 44–121)
BUN/Creatinine Ratio: 13 (ref 10–24)
BUN: 22 mg/dL (ref 8–27)
Bilirubin Total: 0.4 mg/dL (ref 0.0–1.2)
CO2: 20 mmol/L (ref 20–29)
Calcium: 9.9 mg/dL (ref 8.6–10.2)
Chloride: 103 mmol/L (ref 96–106)
Creatinine, Ser: 1.66 mg/dL — ABNORMAL HIGH (ref 0.76–1.27)
Globulin, Total: 2.2 g/dL (ref 1.5–4.5)
Glucose: 103 mg/dL — ABNORMAL HIGH (ref 70–99)
Potassium: 4.3 mmol/L (ref 3.5–5.2)
Sodium: 139 mmol/L (ref 134–144)
Total Protein: 6.5 g/dL (ref 6.0–8.5)
eGFR: 45 mL/min/{1.73_m2} — ABNORMAL LOW (ref >59)

## 2022-06-24 LAB — CBC
Hematocrit: 42.5 % (ref 37.5–51.0)
Hemoglobin: 14 g/dL (ref 13.0–17.7)
MCH: 30.6 pg (ref 26.6–33.0)
MCHC: 32.9 g/dL (ref 31.5–35.7)
MCV: 93 fL (ref 79–97)
Platelets: 215 10*3/uL (ref 150–450)
RBC: 4.57 x10E6/uL (ref 4.14–5.80)
RDW: 12.5 % (ref 11.6–15.4)
WBC: 5.8 10*3/uL (ref 3.4–10.8)

## 2022-06-24 LAB — TESTOSTERONE,FREE AND TOTAL
Testosterone, Free: 13.8 pg/mL (ref 6.6–18.1)
Testosterone: 612 ng/dL (ref 264–916)

## 2022-06-27 NOTE — Progress Notes (Signed)
Letter sent.

## 2022-09-06 DIAGNOSIS — I10 Essential (primary) hypertension: Secondary | ICD-10-CM | POA: Diagnosis not present

## 2022-09-06 DIAGNOSIS — Z7189 Other specified counseling: Secondary | ICD-10-CM | POA: Diagnosis not present

## 2022-09-06 DIAGNOSIS — Z0000 Encounter for general adult medical examination without abnormal findings: Secondary | ICD-10-CM | POA: Diagnosis not present

## 2022-09-06 DIAGNOSIS — E039 Hypothyroidism, unspecified: Secondary | ICD-10-CM | POA: Diagnosis not present

## 2022-09-06 DIAGNOSIS — Z299 Encounter for prophylactic measures, unspecified: Secondary | ICD-10-CM | POA: Diagnosis not present

## 2022-09-06 DIAGNOSIS — Z87891 Personal history of nicotine dependence: Secondary | ICD-10-CM | POA: Diagnosis not present

## 2022-10-24 ENCOUNTER — Ambulatory Visit: Payer: Medicare Other | Admitting: Internal Medicine

## 2022-10-24 ENCOUNTER — Encounter: Payer: Medicare Other | Admitting: Internal Medicine

## 2022-10-24 NOTE — Progress Notes (Signed)
Erroneous encounter - please disregard.

## 2022-10-25 ENCOUNTER — Encounter: Payer: Self-pay | Admitting: Internal Medicine

## 2022-12-25 DIAGNOSIS — E7800 Pure hypercholesterolemia, unspecified: Secondary | ICD-10-CM | POA: Diagnosis not present

## 2022-12-25 DIAGNOSIS — Z299 Encounter for prophylactic measures, unspecified: Secondary | ICD-10-CM | POA: Diagnosis not present

## 2022-12-25 DIAGNOSIS — Z0000 Encounter for general adult medical examination without abnormal findings: Secondary | ICD-10-CM | POA: Diagnosis not present

## 2022-12-25 DIAGNOSIS — Z79899 Other long term (current) drug therapy: Secondary | ICD-10-CM | POA: Diagnosis not present

## 2022-12-25 DIAGNOSIS — M545 Low back pain, unspecified: Secondary | ICD-10-CM | POA: Diagnosis not present

## 2022-12-25 DIAGNOSIS — M159 Polyosteoarthritis, unspecified: Secondary | ICD-10-CM | POA: Diagnosis not present

## 2022-12-25 DIAGNOSIS — I10 Essential (primary) hypertension: Secondary | ICD-10-CM | POA: Diagnosis not present

## 2022-12-25 DIAGNOSIS — R5383 Other fatigue: Secondary | ICD-10-CM | POA: Diagnosis not present

## 2023-06-01 ENCOUNTER — Ambulatory Visit: Payer: Medicare Other | Attending: Internal Medicine | Admitting: Internal Medicine

## 2023-06-01 ENCOUNTER — Encounter: Payer: Self-pay | Admitting: Internal Medicine

## 2023-06-01 NOTE — Progress Notes (Signed)
 Erroneous encounter - please disregard.

## 2023-09-17 DIAGNOSIS — E7800 Pure hypercholesterolemia, unspecified: Secondary | ICD-10-CM | POA: Diagnosis not present

## 2023-09-17 DIAGNOSIS — Z713 Dietary counseling and surveillance: Secondary | ICD-10-CM | POA: Diagnosis not present

## 2023-09-17 DIAGNOSIS — E039 Hypothyroidism, unspecified: Secondary | ICD-10-CM | POA: Diagnosis not present

## 2023-09-17 DIAGNOSIS — Z87891 Personal history of nicotine dependence: Secondary | ICD-10-CM | POA: Diagnosis not present

## 2023-09-17 DIAGNOSIS — Z0000 Encounter for general adult medical examination without abnormal findings: Secondary | ICD-10-CM | POA: Diagnosis not present

## 2023-09-17 DIAGNOSIS — Z299 Encounter for prophylactic measures, unspecified: Secondary | ICD-10-CM | POA: Diagnosis not present

## 2023-09-17 DIAGNOSIS — Z1389 Encounter for screening for other disorder: Secondary | ICD-10-CM | POA: Diagnosis not present

## 2023-09-17 DIAGNOSIS — Z7189 Other specified counseling: Secondary | ICD-10-CM | POA: Diagnosis not present

## 2023-09-17 DIAGNOSIS — I10 Essential (primary) hypertension: Secondary | ICD-10-CM | POA: Diagnosis not present

## 2023-09-17 DIAGNOSIS — R52 Pain, unspecified: Secondary | ICD-10-CM | POA: Diagnosis not present

## 2023-09-17 DIAGNOSIS — Z79899 Other long term (current) drug therapy: Secondary | ICD-10-CM | POA: Diagnosis not present

## 2023-09-17 DIAGNOSIS — R5383 Other fatigue: Secondary | ICD-10-CM | POA: Diagnosis not present

## 2023-10-10 DIAGNOSIS — Z299 Encounter for prophylactic measures, unspecified: Secondary | ICD-10-CM | POA: Diagnosis not present

## 2023-10-10 DIAGNOSIS — N1831 Chronic kidney disease, stage 3a: Secondary | ICD-10-CM | POA: Diagnosis not present

## 2023-10-10 DIAGNOSIS — I10 Essential (primary) hypertension: Secondary | ICD-10-CM | POA: Diagnosis not present

## 2023-10-26 ENCOUNTER — Telehealth: Payer: Self-pay | Admitting: Internal Medicine

## 2023-10-26 NOTE — Telephone Encounter (Signed)
 Called to schedule overdue recall. Pt stated he is not having any problems and does not want to be scheduled. Removing recall from system.

## 2023-11-16 DIAGNOSIS — Z299 Encounter for prophylactic measures, unspecified: Secondary | ICD-10-CM | POA: Diagnosis not present

## 2023-11-16 DIAGNOSIS — I10 Essential (primary) hypertension: Secondary | ICD-10-CM | POA: Diagnosis not present

## 2023-11-16 DIAGNOSIS — N1831 Chronic kidney disease, stage 3a: Secondary | ICD-10-CM | POA: Diagnosis not present

## 2024-01-03 DIAGNOSIS — Z299 Encounter for prophylactic measures, unspecified: Secondary | ICD-10-CM | POA: Diagnosis not present

## 2024-01-03 DIAGNOSIS — M109 Gout, unspecified: Secondary | ICD-10-CM | POA: Diagnosis not present

## 2024-01-03 DIAGNOSIS — Z0000 Encounter for general adult medical examination without abnormal findings: Secondary | ICD-10-CM | POA: Diagnosis not present

## 2024-01-03 DIAGNOSIS — I10 Essential (primary) hypertension: Secondary | ICD-10-CM | POA: Diagnosis not present
